# Patient Record
Sex: Female | Born: 1990 | Race: White | Hispanic: No | Marital: Married | Smoking: Former smoker
Health system: Southern US, Community
[De-identification: ages and names within clinical notes are randomized; demographics above are authoritative.]

## PROBLEM LIST (undated history)

## (undated) ENCOUNTER — Inpatient Hospital Stay (HOSPITAL_COMMUNITY): Payer: Self-pay

## (undated) DIAGNOSIS — F419 Anxiety disorder, unspecified: Secondary | ICD-10-CM

## (undated) DIAGNOSIS — T8859XA Other complications of anesthesia, initial encounter: Secondary | ICD-10-CM

## (undated) DIAGNOSIS — F4541 Pain disorder exclusively related to psychological factors: Secondary | ICD-10-CM

## (undated) DIAGNOSIS — F32A Depression, unspecified: Secondary | ICD-10-CM

## (undated) DIAGNOSIS — R112 Nausea with vomiting, unspecified: Secondary | ICD-10-CM

## (undated) DIAGNOSIS — F329 Major depressive disorder, single episode, unspecified: Secondary | ICD-10-CM

## (undated) DIAGNOSIS — J351 Hypertrophy of tonsils: Secondary | ICD-10-CM

## (undated) DIAGNOSIS — K219 Gastro-esophageal reflux disease without esophagitis: Secondary | ICD-10-CM

## (undated) HISTORY — PX: NO PAST SURGERIES: SHX2092

---

## 2009-06-10 ENCOUNTER — Emergency Department (HOSPITAL_COMMUNITY): Admission: EM | Admit: 2009-06-10 | Discharge: 2009-06-10 | Payer: Self-pay | Admitting: Emergency Medicine

## 2010-04-21 LAB — URINALYSIS, ROUTINE W REFLEX MICROSCOPIC
Hgb urine dipstick: NEGATIVE
Nitrite: NEGATIVE
Specific Gravity, Urine: 1.015 (ref 1.005–1.030)
Urobilinogen, UA: 0.2 mg/dL (ref 0.0–1.0)

## 2010-04-21 LAB — PREGNANCY, URINE: Preg Test, Ur: NEGATIVE

## 2013-08-25 ENCOUNTER — Encounter (HOSPITAL_COMMUNITY): Payer: Self-pay | Admitting: Emergency Medicine

## 2013-08-25 ENCOUNTER — Emergency Department (HOSPITAL_COMMUNITY)
Admission: EM | Admit: 2013-08-25 | Discharge: 2013-08-25 | Disposition: A | Discharge status: Home / Self Care | Payer: Managed Care, Other (non HMO) | Attending: Emergency Medicine | Admitting: Emergency Medicine

## 2013-08-25 DIAGNOSIS — R109 Unspecified abdominal pain: Secondary | ICD-10-CM | POA: Insufficient documentation

## 2013-08-25 DIAGNOSIS — N949 Unspecified condition associated with female genital organs and menstrual cycle: Secondary | ICD-10-CM | POA: Insufficient documentation

## 2013-08-25 DIAGNOSIS — Z3202 Encounter for pregnancy test, result negative: Secondary | ICD-10-CM | POA: Insufficient documentation

## 2013-08-25 DIAGNOSIS — R51 Headache: Secondary | ICD-10-CM | POA: Insufficient documentation

## 2013-08-25 DIAGNOSIS — F17200 Nicotine dependence, unspecified, uncomplicated: Secondary | ICD-10-CM | POA: Insufficient documentation

## 2013-08-25 DIAGNOSIS — R102 Pelvic and perineal pain: Secondary | ICD-10-CM

## 2013-08-25 DIAGNOSIS — M549 Dorsalgia, unspecified: Secondary | ICD-10-CM | POA: Insufficient documentation

## 2013-08-25 DIAGNOSIS — R110 Nausea: Secondary | ICD-10-CM | POA: Insufficient documentation

## 2013-08-25 LAB — CBC WITH DIFFERENTIAL/PLATELET
BASOS ABS: 0 10*3/uL (ref 0.0–0.1)
BASOS PCT: 0 % (ref 0–1)
Band Neutrophils: 0 % (ref 0–10)
Blasts: 0 %
EOS PCT: 2 % (ref 0–5)
Eosinophils Absolute: 0.3 10*3/uL (ref 0.0–0.7)
HEMATOCRIT: 38.9 % (ref 36.0–46.0)
HEMOGLOBIN: 13.3 g/dL (ref 12.0–15.0)
LYMPHS ABS: 2.6 10*3/uL (ref 0.7–4.0)
LYMPHS PCT: 17 % (ref 12–46)
MCH: 29 pg (ref 26.0–34.0)
MCHC: 34.2 g/dL (ref 30.0–36.0)
MCV: 84.9 fL (ref 78.0–100.0)
MONO ABS: 0.2 10*3/uL (ref 0.1–1.0)
MONOS PCT: 1 % — AB (ref 3–12)
Metamyelocytes Relative: 0 %
Myelocytes: 0 %
NEUTROS ABS: 11.9 10*3/uL — AB (ref 1.7–7.7)
NEUTROS PCT: 80 % — AB (ref 43–77)
PROMYELOCYTES ABS: 0 %
Platelets: 325 10*3/uL (ref 150–400)
RBC: 4.58 MIL/uL (ref 3.87–5.11)
RDW: 12.6 % (ref 11.5–15.5)
WBC: 15 10*3/uL — AB (ref 4.0–10.5)
nRBC: 0 /100 WBC

## 2013-08-25 LAB — URINALYSIS, ROUTINE W REFLEX MICROSCOPIC
BILIRUBIN URINE: NEGATIVE
GLUCOSE, UA: NEGATIVE mg/dL
KETONES UR: NEGATIVE mg/dL
Leukocytes, UA: NEGATIVE
Nitrite: NEGATIVE
PH: 6.5 (ref 5.0–8.0)
Protein, ur: NEGATIVE mg/dL
SPECIFIC GRAVITY, URINE: 1.015 (ref 1.005–1.030)
Urobilinogen, UA: 0.2 mg/dL (ref 0.0–1.0)

## 2013-08-25 LAB — WET PREP, GENITAL
CLUE CELLS WET PREP: NONE SEEN
Trich, Wet Prep: NONE SEEN
WBC WET PREP: NONE SEEN
Yeast Wet Prep HPF POC: NONE SEEN

## 2013-08-25 LAB — URINE MICROSCOPIC-ADD ON

## 2013-08-25 LAB — POC URINE PREG, ED: Preg Test, Ur: NEGATIVE

## 2013-08-25 NOTE — ED Provider Notes (Signed)
CSN: [id number]     Arrival date & time 08/25/13  1800 History   First MD Initiated Contact with Patient 08/25/13 1833     Chief Complaint  Patient presents with  . Abdominal Pain     (Consider location/radiation/quality/duration/timing/severity/associated sxs/prior Treatment) Patient is a 23 y.o. female presenting with abdominal pain. The history is provided by the patient.  Abdominal Pain Pain location:  RLQ and LLQ Pain quality: dull   Pain radiates to:  Does not radiate Pain severity:  Moderate Onset quality:  Gradual Duration:  3 days Timing:  Constant Progression:  Worsening Chronicity:  New Relieved by: having a BM helps it for a while. Worsened by:  Nothing tried Ineffective treatments:  NSAIDs Associated symptoms: nausea   Associated symptoms: no anorexia, no chest pain, no chills, no constipation, no cough, no diarrhea, no dysuria, no fever, no hematuria, no melena, no vaginal bleeding, no vaginal discharge and no vomiting    Tiffani Kadow is a 23 y.o. female who presents to the ED with abdominal pain. Patient states she went to Urgent Care 7/17 and was told that her WBC was elevated and gave her an antibiotic. They did not do a pelvic exam. Since then the pain has gotten worse. She had an antibiotic injection at Urgent Care and was given an Rx for a medication to take 3 times a day but she is unsure of what it was and she only took it for 5 days.   History reviewed. No pertinent past medical history. History reviewed. No pertinent past surgical history. Family History  Problem Relation Age of Onset  . Hyperlipidemia Mother   . Hyperlipidemia Father   . Heart failure Brother   . Stroke Brother    History  Substance Use Topics  . Smoking status: Current Some Day Smoker  . Smokeless tobacco: Not on file  . Alcohol Use: No   OB History   Grav Para Term Preterm Abortions TAB SAB Ect Mult Living                 Review of Systems  Constitutional: Negative for  fever and chills.  Eyes: Negative for visual disturbance.  Respiratory: Negative for cough.   Cardiovascular: Negative for chest pain and palpitations.  Gastrointestinal: Positive for nausea and abdominal pain. Negative for vomiting, diarrhea, constipation, blood in stool, melena and anorexia.  Genitourinary: Negative for dysuria, hematuria, vaginal bleeding and vaginal discharge.  Musculoskeletal: Positive for back pain. Negative for neck pain and neck stiffness.  Neurological: Positive for headaches. Negative for seizures and syncope.  Psychiatric/Behavioral: Negative for confusion. The patient is not nervous/anxious.       Allergies  Review of patient's allergies indicates no known allergies.  Home Medications   Prior to Admission medications   Not on File   BP 147/91  Pulse 118  Temp(Src) 98.5 F (36.9 C) (Oral)  Ht 5\' 2"  (1.575 m)  Wt 160 lb (72.576 kg)  BMI 29.26 kg/m2  SpO2 100%  LMP 08/18/2013 Physical Exam  Nursing note and vitals reviewed. Constitutional: She is oriented to person, place, and time. She appears well-developed and well-nourished. No distress.  HENT:  Head: Normocephalic.  Eyes: EOM are normal.  Neck: Neck supple.  Cardiovascular: Normal rate.   Pulmonary/Chest: Effort normal.  Abdominal: Soft. There is tenderness in the right lower quadrant and left lower quadrant. There is no rebound, no guarding and no CVA tenderness.  Genitourinary:  External genitalia without lesions. White discharge vaginal vault. Mild  CMT, bilateral adnexal tenderness. Uterus without palpable enlargement.   Musculoskeletal: Normal range of motion.  Neurological: She is alert and oriented to person, place, and time. No cranial nerve deficit.  Skin: Skin is warm and dry.  Psychiatric: She has a normal mood and affect. Her behavior is normal.   Results for orders placed during the hospital encounter of 08/25/13 (from the past 24 hour(s))  URINALYSIS, ROUTINE W REFLEX  MICROSCOPIC     Status: Abnormal   Collection Time    08/25/13  6:25 PM      Result Value Ref Range   Color, Urine YELLOW  YELLOW   APPearance CLEAR  CLEAR   Specific Gravity, Urine 1.015  1.005 - 1.030   pH 6.5  5.0 - 8.0   Glucose, UA NEGATIVE  NEGATIVE mg/dL   Hgb urine dipstick TRACE (*) NEGATIVE   Bilirubin Urine NEGATIVE  NEGATIVE   Ketones, ur NEGATIVE  NEGATIVE mg/dL   Protein, ur NEGATIVE  NEGATIVE mg/dL   Urobilinogen, UA 0.2  0.0 - 1.0 mg/dL   Nitrite NEGATIVE  NEGATIVE   Leukocytes, UA NEGATIVE  NEGATIVE  URINE MICROSCOPIC-ADD ON     Status: None   Collection Time    08/25/13  6:25 PM      Result Value Ref Range   WBC, UA 0-2  <3 WBC/hpf   RBC / HPF 0-2  <3 RBC/hpf  POC URINE PREG, ED     Status: None   Collection Time    08/25/13  6:50 PM      Result Value Ref Range   Preg Test, Ur NEGATIVE  NEGATIVE  CBC WITH DIFFERENTIAL     Status: Abnormal   Collection Time    08/25/13  7:34 PM      Result Value Ref Range   WBC 15.0 (*) 4.0 - 10.5 K/uL   RBC 4.58  3.87 - 5.11 MIL/uL   Hemoglobin 13.3  12.0 - 15.0 g/dL   HCT 16.138.9  09.636.0 - 04.546.0 %   MCV 84.9  78.0 - 100.0 fL   MCH 29.0  26.0 - 34.0 pg   MCHC 34.2  30.0 - 36.0 g/dL   RDW 40.912.6  81.111.5 - 91.415.5 %   Platelets 325  150 - 400 K/uL   Neutrophils Relative % 80 (*) 43 - 77 %   Lymphocytes Relative 17  12 - 46 %   Monocytes Relative 1 (*) 3 - 12 %   Eosinophils Relative 2  0 - 5 %   Basophils Relative 0  0 - 1 %   Band Neutrophils 0  0 - 10 %   Metamyelocytes Relative 0     Myelocytes 0     Promyelocytes Absolute 0     Blasts 0     nRBC 0  0 /100 WBC   Neutro Abs 11.9 (*) 1.7 - 7.7 K/uL   Lymphs Abs 2.6  0.7 - 4.0 K/uL   Monocytes Absolute 0.2  0.1 - 1.0 K/uL   Eosinophils Absolute 0.3  0.0 - 0.7 K/uL   Basophils Absolute 0.0  0.0 - 0.1 K/uL  WET PREP, GENITAL     Status: None   Collection Time    08/25/13  7:40 PM      Result Value Ref Range   Yeast Wet Prep HPF POC NONE SEEN  NONE SEEN   Trich, Wet Prep  NONE SEEN  NONE SEEN   Clue Cells Wet Prep HPF POC NONE SEEN  NONE SEEN   WBC, Wet Prep HPF POC NONE SEEN  NONE SEEN    ED Course  Procedures (including critical care time) Labs Review  MDM  23 y.o. female with pelvic pain x 3 days. Doubt torsion as the pain is not severe, no concern for ectopic pregnancy since pregnancy test is negative. Will return for ultrasound tomorrow for possible pelvic infection vs ovarian cyst. Concern for infection due to elevated WBC. Stable for discharge. Patient does not want pain medication.     [place], [place] 08/25/13 2038

## 2013-08-25 NOTE — ED Notes (Signed)
Patient with no complaints at this time. Respirations even and unlabored. Skin warm/dry. Discharge instructions reviewed with patient at this time. Patient given opportunity to voice concerns/ask questions. Patient discharged at this time and left Emergency Department with steady gait.   

## 2013-08-25 NOTE — Discharge Instructions (Signed)
Return in the morning for ultrasound. Return sooner for any problems.

## 2013-08-25 NOTE — ED Notes (Signed)
Pain in lower abdomen.  Rates pain 5.  Took ibuprofen, with no relief.

## 2013-08-26 ENCOUNTER — Other Ambulatory Visit (HOSPITAL_COMMUNITY): Payer: Self-pay | Admitting: Nurse Practitioner

## 2013-08-26 ENCOUNTER — Ambulatory Visit (HOSPITAL_COMMUNITY)
Admit: 2013-08-26 | Discharge: 2013-08-26 | Disposition: A | Discharge status: Home / Self Care | Payer: Managed Care, Other (non HMO) | Source: Ambulatory Visit | Attending: Emergency Medicine | Admitting: Emergency Medicine

## 2013-08-26 DIAGNOSIS — R102 Pelvic and perineal pain: Secondary | ICD-10-CM

## 2013-08-26 DIAGNOSIS — D72829 Elevated white blood cell count, unspecified: Secondary | ICD-10-CM | POA: Insufficient documentation

## 2013-08-26 DIAGNOSIS — N949 Unspecified condition associated with female genital organs and menstrual cycle: Secondary | ICD-10-CM | POA: Insufficient documentation

## 2013-08-26 LAB — HIV ANTIBODY (ROUTINE TESTING W REFLEX): HIV: NONREACTIVE

## 2013-08-26 LAB — RPR

## 2013-08-26 NOTE — ED Provider Notes (Signed)
Pt seen in f/u for outpatient ultrasound Pelvic US negative Advised pt if she is still having symptoms she can check back in for re-evaluation Otherwise she can f/u with gynecology Informed that other testing (STD/HIV) still pending at this time Pt awake/alert, stable appearing   Joya Gaskinsonald W Duriel Deery, MD 08/26/13 1058

## 2013-08-27 ENCOUNTER — Telehealth (HOSPITAL_BASED_OUTPATIENT_CLINIC_OR_DEPARTMENT_OTHER): Payer: Self-pay

## 2013-08-27 LAB — GC/CHLAMYDIA PROBE AMP
CT Probe RNA: NEGATIVE
GC Probe RNA: NEGATIVE

## 2013-08-28 ENCOUNTER — Telehealth (HOSPITAL_BASED_OUTPATIENT_CLINIC_OR_DEPARTMENT_OTHER): Payer: Self-pay

## 2013-08-28 NOTE — ED Provider Notes (Signed)
Medical screening examination/treatment/procedure(s) were conducted as a shared visit with non-physician practitioner(s) and myself.  I personally evaluated the patient during the encounter.   EKG Interpretation None     No acute abdomen. No evidence of appendicitis. Will return tomorrow for ultrasound of pelvis   Donnetta HutchingBrian Richie Vadala, MD 08/28/13 1354

## 2013-11-24 ENCOUNTER — Encounter (HOSPITAL_COMMUNITY): Payer: Self-pay | Admitting: Emergency Medicine

## 2013-11-24 ENCOUNTER — Emergency Department (HOSPITAL_COMMUNITY)
Admission: EM | Admit: 2013-11-24 | Discharge: 2013-11-24 | Disposition: A | Discharge status: Home / Self Care | Payer: Managed Care, Other (non HMO) | Attending: Emergency Medicine | Admitting: Emergency Medicine

## 2013-11-24 DIAGNOSIS — R29810 Facial weakness: Secondary | ICD-10-CM | POA: Diagnosis present

## 2013-11-24 DIAGNOSIS — Z79899 Other long term (current) drug therapy: Secondary | ICD-10-CM | POA: Diagnosis not present

## 2013-11-24 DIAGNOSIS — Z720 Tobacco use: Secondary | ICD-10-CM | POA: Insufficient documentation

## 2013-11-24 DIAGNOSIS — R200 Anesthesia of skin: Secondary | ICD-10-CM | POA: Diagnosis not present

## 2013-11-24 DIAGNOSIS — G510 Bell's palsy: Secondary | ICD-10-CM | POA: Diagnosis not present

## 2013-11-24 DIAGNOSIS — Z792 Long term (current) use of antibiotics: Secondary | ICD-10-CM | POA: Insufficient documentation

## 2013-11-24 MED ORDER — PREDNISONE 50 MG PO TABS: ORAL_TABLET | ORAL | Status: DC

## 2013-11-24 MED ORDER — TOBRAMYCIN 0.3 % OP SOLN: 1.0000 [drp] | OPHTHALMIC | Status: DC

## 2013-11-24 MED ORDER — VALACYCLOVIR HCL 1 G PO TABS: 500.0000 mg | ORAL_TABLET | Freq: Two times a day (BID) | ORAL | Status: AC

## 2013-11-24 NOTE — ED Notes (Addendum)
Per patient woke this morning with left side facial drooping. Per patient started last night around 9-9:30 but cleared up on it's on. Patient states this has happened before but went away by itself. Denies any weakness, dizziness, or headache.

## 2013-11-24 NOTE — ED Provider Notes (Signed)
CSN: [id number]     Arrival date & time 11/24/13  1038 History  This chart was scribed for Donnetta HutchingBrian Loris Seelye, MD by Karle PlumberJennifer Tensley, ED Scribe. This patient was seen in room APA07/[facility]07 and the patient's care was started at 3:25 PM.  Chief Complaint  Patient presents with  . Facial Droop   HPI HPI Comments:  Alicia Goodman is a 23 y.o. female who presents to the Emergency Department complaining of left-sided facial droop that began last night. She reports watering of her right eye and dryness of her left eye. She states the left side of her smile is crooked and has had some tongue numbness. She denies taste change or pain. She denies any chronic illnesses. Denies any recent illnesses. Denies any extremity weakness or numbness, HA, fever or chills.  History reviewed. No pertinent past medical history. History reviewed. No pertinent past surgical history. Family History  Problem Relation Age of Onset  . Hyperlipidemia Mother   . Hyperlipidemia Father   . Heart failure Brother   . Stroke Brother    History  Substance Use Topics  . Smoking status: Current Some Day Smoker    Types: Cigarettes  . Smokeless tobacco: Never Used  . Alcohol Use: No   OB History   Grav Para Term Preterm Abortions TAB SAB Ect Mult Living   1 1 1       1      Review of Systems  Constitutional: Negative for fever and chills.  Musculoskeletal: Negative for back pain and neck pain.  Skin: Negative for wound.  Neurological: Positive for facial asymmetry and numbness. Negative for weakness and headaches.  All other systems reviewed and are negative.   Allergies  Review of patient's allergies indicates no known allergies.  Home Medications   Prior to Admission medications   Medication Sig Start Date End Date Taking? Authorizing Provider  ibuprofen (ADVIL,MOTRIN) 200 MG tablet Take 600 mg by mouth every 4 (four) hours as needed for mild pain or moderate pain.    Yes Historical Provider, MD  predniSONE  (DELTASONE) 50 MG tablet One tab qd for 5 days;  One half tab qd for 5 days 11/24/13   Donnetta HutchingBrian Savannha Welle, MD  tobramycin (TOBREX) 0.3 % ophthalmic solution Place 1 drop into the right eye every 4 (four) hours. 11/24/13   Donnetta HutchingBrian Bates [place], MD  valACYclovir (VALTREX) 1000 MG tablet Take 0.5 tablets (500 mg total) by mouth 2 (two) times daily. 11/24/13 12/08/13  Donnetta HutchingBrian Tayjon Halladay, MD   Triage Vitals: BP 137/98  Pulse 119  Temp(Src) 98.3 F (36.8 C) (Oral)  Resp 16  Ht 5\' 2"  (1.575 m)  Wt 171 lb 11.2 oz (77.883 kg)  BMI 31.40 kg/m2  SpO2 100%  LMP 11/17/2013 Physical Exam  Nursing note and vitals reviewed. Constitutional: She is oriented to person, place, and time. She appears well-developed and well-nourished.  HENT:  Head: Normocephalic and atraumatic.  Right eye conjunctiva inflammation. Numbness of left cheek. Asymmetry of smile.  Eyes: Conjunctivae and EOM are normal. Pupils are equal, round, and reactive to light.  Neck: Normal range of motion. Neck supple.  Cardiovascular: Normal rate, regular rhythm and normal heart sounds.   Pulmonary/Chest: Effort normal and breath sounds normal.  Abdominal: Soft. Bowel sounds are normal.  Musculoskeletal: Normal range of motion.  Neurological: She is alert and oriented to person, place, and time.  Skin: Skin is warm and dry.  Psychiatric: She has a normal mood and affect. Her behavior is normal.  ED Course  Procedures (including critical care time) DIAGNOSTIC STUDIES: Oxygen Saturation is 100% on RA, normal by my interpretation.   COORDINATION OF CARE: 3:33 PM- Will prescribe Prednisone, antiviral medication and antibiotic opthalmic drops. Pt verbalizes understanding and agrees to plan.  Medications - No data to display  Labs Review Labs Reviewed - No data to display  Imaging Review No results found.   EKG Interpretation None      MDM   Final diagnoses:  Bell's palsy    History and physical consistent with Bell's palsy.   Rx Valtrex and  prednisone. Tobramycin eyedrops for conjunctivitis of the right eye  I personally performed the services described in this documentation, which was scribed in my presence. The recorded information has been reviewed and is accurate.    Donnetta HutchingBrian Haydan Wedig, MD 11/29/13 [phone]

## 2013-11-24 NOTE — Discharge Instructions (Signed)
Bell's Palsy Bell's palsy is a condition in which the muscles on one side of the face cannot move (paralysis). This is because the nerves in the face are paralyzed. It is most often thought to be caused by a virus. The virus causes swelling of the nerve that controls movement on one side of the face. The nerve travels through a tight space surrounded by bone. When the nerve swells, it can be compressed by the bone. This results in damage to the protective covering around the nerve. This damage interferes with how the nerve communicates with the muscles of the face. As a result, it can cause weakness or paralysis of the facial muscles.  Injury (trauma), tumor, and surgery may cause Bell's palsy, but most of the time the cause is unknown. It is a relatively common condition. It starts suddenly (abrupt onset) with the paralysis usually ending within 2 days. Bell's palsy is not dangerous. But because the eye does not close properly, you may need care to keep the eye from getting dry. This can include splinting (to keep the eye shut) or moistening with artificial tears. Bell's palsy very seldom occurs on both sides of the face at the same time. SYMPTOMS   Eyebrow sagging.  Drooping of the eyelid and corner of the mouth.  Inability to close one eye.  Loss of taste on the front of the tongue.  Sensitivity to loud noises. TREATMENT  The treatment is usually non-surgical. If the patient is seen within the first 24 to 48 hours, a short course of steroids may be prescribed, in an attempt to shorten the length of the condition. Antiviral medicines may also be used with the steroids, but it is unclear if they are helpful.  You will need to protect your eye, if you cannot close it. The cornea (clear covering over your eye) will become dry and can be damaged. Artificial tears can be used to keep your eye moist. Glasses or an eye patch should be worn to protect your eye. PROGNOSIS  Recovery is variable, ranging  from days to months. Although the problem usually goes away completely (about 80% of cases resolve), predicting the outcome is impossible. Most people improve within 3 weeks of when the symptoms began. Improvement may continue for 3 to 6 months. A small number of people have moderate to severe weakness that is permanent.  HOME CARE INSTRUCTIONS   If your caregiver prescribed medication to reduce swelling in the nerve, use as directed. Do not stop taking the medication unless directed by your caregiver.  Use moisturizing eye drops as needed to prevent drying of your eye, as directed by your caregiver.  Protect your eye, as directed by your caregiver.  Use facial massage and exercises, as directed by your caregiver.  Perform your normal activities, and get your normal rest. SEEK IMMEDIATE MEDICAL CARE IF:   There is pain, redness or irritation in the eye.  You or your child has an oral temperature above 102 F (38.9 C), not controlled by medicine. MAKE SURE YOU:   Understand these instructions.  Will watch your condition.  Will get help right away if you are not doing well or get worse. Document Released: 01/18/2005 Document Revised: 04/12/2011 Document Reviewed: 04/27/2013 [facility] Patient Information 2015 [employer] [place]. This information is not intended to replace advice given to you by your health care provider. Make sure you discuss any questions you have with your health care provider.  Prescriptions for prednisone, eyedrops, antiviral medication.

## 2013-12-03 ENCOUNTER — Encounter (HOSPITAL_COMMUNITY): Payer: Self-pay | Admitting: Emergency Medicine

## 2017-02-01 HISTORY — PX: TONSILLECTOMY: SUR1361

## 2017-09-29 ENCOUNTER — Ambulatory Visit (INDEPENDENT_AMBULATORY_CARE_PROVIDER_SITE_OTHER): Payer: BLUE CROSS/BLUE SHIELD | Admitting: Otolaryngology

## 2017-09-29 DIAGNOSIS — J351 Hypertrophy of tonsils: Secondary | ICD-10-CM

## 2017-09-29 DIAGNOSIS — J3501 Chronic tonsillitis: Secondary | ICD-10-CM | POA: Diagnosis not present

## 2017-10-26 ENCOUNTER — Other Ambulatory Visit: Payer: Self-pay | Admitting: Otolaryngology

## 2017-11-01 DIAGNOSIS — J351 Hypertrophy of tonsils: Secondary | ICD-10-CM

## 2017-11-01 HISTORY — DX: Hypertrophy of tonsils: J35.1

## 2017-11-08 ENCOUNTER — Encounter (HOSPITAL_BASED_OUTPATIENT_CLINIC_OR_DEPARTMENT_OTHER): Payer: Self-pay | Admitting: *Deleted

## 2017-11-08 ENCOUNTER — Other Ambulatory Visit: Payer: Self-pay

## 2017-11-15 ENCOUNTER — Other Ambulatory Visit: Payer: Self-pay

## 2017-11-15 ENCOUNTER — Ambulatory Visit (HOSPITAL_BASED_OUTPATIENT_CLINIC_OR_DEPARTMENT_OTHER): Payer: BLUE CROSS/BLUE SHIELD | Admitting: Anesthesiology

## 2017-11-15 ENCOUNTER — Encounter (HOSPITAL_BASED_OUTPATIENT_CLINIC_OR_DEPARTMENT_OTHER): Payer: Self-pay

## 2017-11-15 ENCOUNTER — Encounter (HOSPITAL_BASED_OUTPATIENT_CLINIC_OR_DEPARTMENT_OTHER)
Admission: RE | Disposition: A | Discharge status: Home / Self Care | Payer: Self-pay | Source: Ambulatory Visit | Attending: Otolaryngology

## 2017-11-15 ENCOUNTER — Ambulatory Visit (HOSPITAL_BASED_OUTPATIENT_CLINIC_OR_DEPARTMENT_OTHER)
Admission: RE | Admit: 2017-11-15 | Discharge: 2017-11-15 | Disposition: A | Discharge status: Home / Self Care | Payer: BLUE CROSS/BLUE SHIELD | Source: Ambulatory Visit | Attending: Otolaryngology | Admitting: Otolaryngology

## 2017-11-15 DIAGNOSIS — J312 Chronic pharyngitis: Secondary | ICD-10-CM | POA: Diagnosis not present

## 2017-11-15 DIAGNOSIS — J353 Hypertrophy of tonsils with hypertrophy of adenoids: Secondary | ICD-10-CM | POA: Diagnosis present

## 2017-11-15 DIAGNOSIS — K219 Gastro-esophageal reflux disease without esophagitis: Secondary | ICD-10-CM | POA: Insufficient documentation

## 2017-11-15 DIAGNOSIS — F329 Major depressive disorder, single episode, unspecified: Secondary | ICD-10-CM | POA: Diagnosis not present

## 2017-11-15 DIAGNOSIS — J3501 Chronic tonsillitis: Secondary | ICD-10-CM | POA: Insufficient documentation

## 2017-11-15 HISTORY — DX: Depression, unspecified: F32.A

## 2017-11-15 HISTORY — DX: Hypertrophy of tonsils: J35.1

## 2017-11-15 HISTORY — DX: Pain disorder exclusively related to psychological factors: F45.41

## 2017-11-15 HISTORY — PX: TONSILLECTOMY AND ADENOIDECTOMY: SHX28

## 2017-11-15 HISTORY — DX: Gastro-esophageal reflux disease without esophagitis: K21.9

## 2017-11-15 HISTORY — DX: Major depressive disorder, single episode, unspecified: F32.9

## 2017-11-15 SURGERY — TONSILLECTOMY AND ADENOIDECTOMY
Anesthesia: General | Laterality: Bilateral

## 2017-11-15 MED ORDER — PROPOFOL 10 MG/ML IV BOLUS
INTRAVENOUS | Status: AC
  Filled 2017-11-15: qty 20

## 2017-11-15 MED ORDER — KETOROLAC TROMETHAMINE 30 MG/ML IJ SOLN
INTRAMUSCULAR | Status: AC
  Filled 2017-11-15: qty 1

## 2017-11-15 MED ORDER — FENTANYL CITRATE (PF) 100 MCG/2ML IJ SOLN
INTRAMUSCULAR | Status: AC
  Filled 2017-11-15: qty 2

## 2017-11-15 MED ORDER — DEXAMETHASONE SODIUM PHOSPHATE 10 MG/ML IJ SOLN
INTRAMUSCULAR | Status: AC
  Filled 2017-11-15: qty 1

## 2017-11-15 MED ORDER — ONDANSETRON HCL 4 MG/2ML IJ SOLN
INTRAMUSCULAR | Status: AC
  Filled 2017-11-15: qty 2

## 2017-11-15 MED ORDER — OXYCODONE HCL 5 MG PO TABS: 5.0000 mg | ORAL_TABLET | Freq: Once | ORAL | Status: DC | PRN

## 2017-11-15 MED ORDER — SUCCINYLCHOLINE CHLORIDE 200 MG/10ML IV SOSY
PREFILLED_SYRINGE | INTRAVENOUS | Status: AC
  Filled 2017-11-15: qty 10

## 2017-11-15 MED ORDER — DEXAMETHASONE SODIUM PHOSPHATE 4 MG/ML IJ SOLN
INTRAMUSCULAR | Status: DC | PRN
  Administered 2017-11-15: 10 mg via INTRAVENOUS

## 2017-11-15 MED ORDER — HYDROMORPHONE HCL 1 MG/ML IJ SOLN: 0.2500 mg | INTRAMUSCULAR | Status: DC | PRN

## 2017-11-15 MED ORDER — OXYCODONE HCL 5 MG/5ML PO SOLN: 5.0000 mg | ORAL | 0 refills | Status: DC | PRN

## 2017-11-15 MED ORDER — SCOPOLAMINE 1 MG/3DAYS TD PT72: 1.0000 | MEDICATED_PATCH | Freq: Once | TRANSDERMAL | Status: DC | PRN

## 2017-11-15 MED ORDER — OXYMETAZOLINE HCL 0.05 % NA SOLN
NASAL | Status: DC | PRN
  Administered 2017-11-15: 1 via TOPICAL

## 2017-11-15 MED ORDER — LABETALOL HCL 5 MG/ML IV SOLN
INTRAVENOUS | Status: AC
  Filled 2017-11-15: qty 4

## 2017-11-15 MED ORDER — OXYCODONE HCL 5 MG/5ML PO SOLN: 5.0000 mg | Freq: Once | ORAL | Status: DC | PRN

## 2017-11-15 MED ORDER — MIDAZOLAM HCL 2 MG/2ML IJ SOLN
INTRAMUSCULAR | Status: AC
  Filled 2017-11-15: qty 2

## 2017-11-15 MED ORDER — ALBUTEROL SULFATE HFA 108 (90 BASE) MCG/ACT IN AERS
INHALATION_SPRAY | RESPIRATORY_TRACT | Status: DC | PRN
  Administered 2017-11-15 (×3): 2 via RESPIRATORY_TRACT

## 2017-11-15 MED ORDER — LIDOCAINE 2% (20 MG/ML) 5 ML SYRINGE
INTRAMUSCULAR | Status: AC
  Filled 2017-11-15: qty 5

## 2017-11-15 MED ORDER — PROPOFOL 10 MG/ML IV BOLUS
INTRAVENOUS | Status: DC | PRN
  Administered 2017-11-15: 30 mg via INTRAVENOUS
  Administered 2017-11-15: 150 mg via INTRAVENOUS
  Administered 2017-11-15: 20 mg via INTRAVENOUS

## 2017-11-15 MED ORDER — SODIUM CHLORIDE 0.9 % IR SOLN
Status: DC | PRN
  Administered 2017-11-15: 1

## 2017-11-15 MED ORDER — FENTANYL CITRATE (PF) 100 MCG/2ML IJ SOLN
50.0000 ug | INTRAMUSCULAR | Status: DC | PRN
  Administered 2017-11-15 (×2): 100 ug via INTRAVENOUS

## 2017-11-15 MED ORDER — LIDOCAINE HCL (CARDIAC) PF 100 MG/5ML IV SOSY
PREFILLED_SYRINGE | INTRAVENOUS | Status: DC | PRN
  Administered 2017-11-15: 50 mg via INTRAVENOUS
  Administered 2017-11-15: 100 mg via INTRAVENOUS

## 2017-11-15 MED ORDER — LACTATED RINGERS IV SOLN
INTRAVENOUS | Status: DC
  Administered 2017-11-15 (×2): via INTRAVENOUS

## 2017-11-15 MED ORDER — MEPERIDINE HCL 25 MG/ML IJ SOLN: 6.2500 mg | INTRAMUSCULAR | Status: DC | PRN

## 2017-11-15 MED ORDER — PROMETHAZINE HCL 25 MG/ML IJ SOLN: 6.2500 mg | INTRAMUSCULAR | Status: DC | PRN

## 2017-11-15 MED ORDER — AMOXICILLIN 400 MG/5ML PO SUSR: 800.0000 mg | Freq: Two times a day (BID) | ORAL | 0 refills | Status: AC

## 2017-11-15 MED ORDER — LABETALOL HCL 5 MG/ML IV SOLN
10.0000 mg | INTRAVENOUS | Status: DC | PRN
  Administered 2017-11-15: 5 mg via INTRAVENOUS

## 2017-11-15 MED ORDER — MIDAZOLAM HCL 2 MG/2ML IJ SOLN
1.0000 mg | INTRAMUSCULAR | Status: DC | PRN
  Administered 2017-11-15: 2 mg via INTRAVENOUS

## 2017-11-15 MED ORDER — GLYCOPYRROLATE 0.2 MG/ML IJ SOLN
INTRAMUSCULAR | Status: DC | PRN
  Administered 2017-11-15: 0.2 mg via INTRAVENOUS

## 2017-11-15 MED ORDER — ONDANSETRON HCL 4 MG/2ML IJ SOLN
INTRAMUSCULAR | Status: DC | PRN
  Administered 2017-11-15: 4 mg via INTRAVENOUS

## 2017-11-15 SURGICAL SUPPLY — 32 items
BANDAGE COBAN STERILE 2 (GAUZE/BANDAGES/DRESSINGS) IMPLANT
CANISTER SUCT 1200ML W/VALVE (MISCELLANEOUS) ×3 IMPLANT
CATH ROBINSON RED A/P 10FR (CATHETERS) IMPLANT
CATH ROBINSON RED A/P 14FR (CATHETERS) ×3 IMPLANT
COAGULATOR SUCT 6 FR SWTCH (ELECTROSURGICAL)
COAGULATOR SUCT SWTCH 10FR 6 (ELECTROSURGICAL) IMPLANT
COVER BACK TABLE 60X90IN (DRAPES) ×3 IMPLANT
COVER MAYO STAND STRL (DRAPES) ×3 IMPLANT
COVER WAND RF STERILE (DRAPES) IMPLANT
ELECT REM PT RETURN 9FT ADLT (ELECTROSURGICAL) ×3
ELECT REM PT RETURN 9FT PED (ELECTROSURGICAL)
ELECTRODE REM PT RETRN 9FT PED (ELECTROSURGICAL) IMPLANT
ELECTRODE REM PT RTRN 9FT ADLT (ELECTROSURGICAL) ×1 IMPLANT
GAUZE SPONGE 4X4 12PLY STRL LF (GAUZE/BANDAGES/DRESSINGS) ×3 IMPLANT
GLOVE BIO SURGEON STRL SZ7.5 (GLOVE) ×3 IMPLANT
GOWN STRL REUS W/ TWL LRG LVL3 (GOWN DISPOSABLE) ×1 IMPLANT
GOWN STRL REUS W/TWL LRG LVL3 (GOWN DISPOSABLE) ×2
IV NS 500ML (IV SOLUTION) ×2
IV NS 500ML BAXH (IV SOLUTION) ×1 IMPLANT
MARKER SKIN DUAL TIP RULER LAB (MISCELLANEOUS) IMPLANT
NS IRRIG 1000ML POUR BTL (IV SOLUTION) ×3 IMPLANT
SHEET MEDIUM DRAPE 40X70 STRL (DRAPES) ×3 IMPLANT
SOLUTION BUTLER CLEAR DIP (MISCELLANEOUS) ×3 IMPLANT
SPONGE TONSIL TAPE 1 RFD (DISPOSABLE) IMPLANT
SPONGE TONSIL TAPE 1.25 RFD (DISPOSABLE) ×3 IMPLANT
SYR BULB 3OZ (MISCELLANEOUS) IMPLANT
TOWEL GREEN STERILE FF (TOWEL DISPOSABLE) ×3 IMPLANT
TUBE CONNECTING 20'X1/4 (TUBING) ×1
TUBE CONNECTING 20X1/4 (TUBING) ×2 IMPLANT
TUBE SALEM SUMP 12R W/ARV (TUBING) IMPLANT
TUBE SALEM SUMP 16 FR W/ARV (TUBING) ×3 IMPLANT
WAND COBLATOR 70 EVAC XTRA (SURGICAL WAND) ×3 IMPLANT

## 2017-11-15 NOTE — Anesthesia Procedure Notes (Signed)
Procedure Name: Intubation Date/Time: 11/15/2017 7:59 AM Performed by: Lyndee Leo, CRNA Pre-anesthesia Checklist: Patient identified, Emergency Drugs available, Suction available and Patient being monitored Patient Re-evaluated:Patient Re-evaluated prior to induction Oxygen Delivery Method: Circle system utilized Preoxygenation: Pre-oxygenation with 100% oxygen Induction Type: IV induction Ventilation: Mask ventilation without difficulty Laryngoscope Size: Mac and 3 Grade View: Grade I Tube type: Oral Tube size: 7.0 mm Number of attempts: 1 Airway Equipment and Method: Stylet and Oral airway Placement Confirmation: ETT inserted through vocal cords under direct vision,  positive ETCO2 and breath sounds checked- equal and bilateral Secured at: 22 cm Tube secured with: Tape Dental Injury: Teeth and Oropharynx as per pre-operative assessment

## 2017-11-15 NOTE — H&P (Signed)
Cc: Recurrent tonsillitis  HPI: The patient is a 27 y/o female who presents today for evaluation of recurrent tonsillitis. The patient is seen in consultation requested by [facility]. The patient has noted recurrent sore throat for 3+ years. She has been diagnosed with strep 3-4 times this year. The patient was last treated a few weeks ago. She also notes loud snoring, tonsil stones, and halitosis. The patient is otherwise healthy. No previous ENT surgery is noted.   The patient's review of systems (constitutional, eyes, ENT, cardiovascular, respiratory, GI, musculoskeletal, skin, neurologic, psychiatric, endocrine, hematologic, allergic) is noted in the ROS questionnaire.  It is reviewed with the patient.   Family health history: None.   Major events: None.   Ongoing medical problems: None.   Social history: The patient is single. She denies the use of tobacco, alcohol or illegal drugs.   Exam:  General: Communicates without difficulty, well nourished, no acute distress. Head:  Normocephalic, no lesions or asymmetry. Eyes: PERRL, EOMI. No scleral icterus, conjunctivae clear.  Neuro: CN II exam reveals vision grossly intact.  No nystagmus at any point of gaze. Ears:  EAC normal without erythema AU.  TM intact without fluid and mobile AU. Nose: Moist, pink mucosa without lesions or mass. Mouth: Oral cavity clear and moist, no lesions, tonsils symmetric. Tonsils are 3+. Tonsils with mild erythema. Neck: Full range of motion, no lymphadenopathy or masses.   Assessment  1.  The patient's history and physical exam findings are consistent with chronic tonsillitis/pharyngitis secondary to adenotonsillar hypertrophy.  Plan 1. The treatment options include continuing conservative observation versus tonsillectomy.  Based on the patient's history and physical exam findings, the patient will likely benefit from having the tonsils and possibly the adenoid removed.  The risks, benefits,  alternatives, and details of the procedure are reviewed with the patient and the parent.  Questions are invited and answered.  2. The patient is interested in proceeding with the procedure.  We will schedule the procedure in accordance with the family schedule.

## 2017-11-15 NOTE — Transfer of Care (Signed)
Immediate Anesthesia Transfer of Care Note  Patient: Alicia Goodman  Procedure(s) Performed: TONSILLECTOMY (Bilateral )  Patient Location: PACU  Anesthesia Type:General  Level of Consciousness: awake, alert  and oriented  Airway & Oxygen Therapy: Patient Spontanous Breathing and Patient connected to face mask oxygen  Post-op Assessment: Report given to RN and Post -op Vital signs reviewed and stable  Post vital signs: Reviewed and stable  Last Vitals:  Vitals Value Taken Time  BP 149/102 11/15/2017  8:34 AM  Temp    Pulse 135 11/15/2017  8:38 AM  Resp 21 11/15/2017  8:38 AM  SpO2 100 % 11/15/2017  8:38 AM  Vitals shown include unvalidated device data.  Last Pain:  Vitals:   11/15/17 0652  TempSrc: Oral  PainSc: 0-No pain         Complications: No apparent anesthesia complications

## 2017-11-15 NOTE — Discharge Instructions (Addendum)
[facility] Postoperative Instructions for Tonsillectomy & Adenoidectomy (T&A) Activity Restrict activity at home for the first two days, resting as much as possible. Light indoor activity is best. You may usually return to school or work within a week but void strenuous activity and sports for two weeks. Sleep with your head elevated on 2-3 pillows for 3-4 days to help decrease swelling. Diet Due to tissue swelling and throat discomfort, you may have little desire to drink for several days. However fluids are very important to prevent dehydration. You will find that non-acidic juices, soups, popsicles, Jell-O, custard, puddings, and any soft or mashed foods taken in small quantities can be swallowed fairly easily. Try to increase your fluid and food intake as the discomfort subsides. It is recommended that a child receive 1-1/2 quarts of fluid in a 24-hour period. Adult require twice this amount.  Discomfort Your sore throat may be relieved by applying an ice collar to your neck and/or by taking Tylenol. You may experience an earache, which is due to referred pain from the throat. Referred ear pain is commonly felt at night when trying to rest.  Bleeding                        Although rare, there is risk of having some bleeding during the first 2 weeks after having a T&A. This usually happens between days 7-10 postoperatively. If you or your child should have any bleeding, try to remain calm. We recommend sitting up quietly in a chair and gently spitting out the blood into a bowl. For adults, gargling gently with ice water may help. If the bleeding does not stop after a short time (5 minutes), is more than 1 teaspoonful, or if you become worried, please call our office at [phone] or go directly to the nearest hospital emergency room. Do not eat or drink anything prior to going to the hospital as you may need to be taken to the operating room in order to control the bleeding. GENERAL  CONSIDERATIONS 1. Brush your teeth regularly. Avoid mouthwashes and gargles for three weeks. You may gargle gently with warm salt-water as necessary or spray with Chloraseptic. You may make salt-water by placing 2 teaspoons of table salt into a quart of fresh water. Warm the salt-water in a microwave to a luke warm temperature.  2. Avoid exposure to colds and upper respiratory infections if possible.  3. If you look into a mirror or into your child's mouth, you will see white-gray patches in the back of the throat. This is normal after having a T&A and is like a scab that forms on the skin after an abrasion. It will disappear once the back of the throat heals completely. However, it may cause a noticeable odor; this too will disappear with time. Again, warm salt-water gargles may be used to help keep the throat clean and promote healing.  4. You may notice a temporary change in voice quality, such as a higher pitched voice or a nasal sound, until healing is complete. This may last for 1-2 weeks and should resolve.  5. Do not take or give you child any medications that we have not prescribed or recommended.  6. Snoring may occur, especially at night, for the first week after a T&A. It is due to swelling of the soft palate and will usually resolve.  Please call our office at [phone] if you have any questions.       Post Anesthesia Home Care Instructions  Activity: Get plenty of rest for the remainder of the day. A responsible individual must stay with you for 24 hours following the procedure.  For the next 24 hours, DO NOT: -Drive a car -Operate machinery -Drink alcoholic beverages -Take any medication unless instructed by your physician -Make any legal decisions or sign important papers.  Meals: Start with liquid foods such as gelatin or soup. Progress to regular foods as tolerated. Avoid greasy, spicy, heavy foods. If nausea and/or vomiting occur, drink only clear liquids until the nausea  and/or vomiting subsides. Call your physician if vomiting continues.  Special Instructions/Symptoms: Your throat may feel dry or sore from the anesthesia or the breathing tube placed in your throat during surgery. If this causes discomfort, gargle with warm salt water. The discomfort should disappear within 24 hours.  If you had a scopolamine patch placed behind your ear for the management of post- operative nausea and/or vomiting:  1. The medication in the patch is effective for 72 hours, after which it should be removed.  Wrap patch in a tissue and discard in the trash. Wash hands thoroughly with soap and water. 2. You may remove the patch earlier than 72 hours if you experience unpleasant side effects which may include dry mouth, dizziness or visual disturbances. 3. Avoid touching the patch. Wash your hands with soap and water after contact with the patch.     

## 2017-11-15 NOTE — Anesthesia Preprocedure Evaluation (Signed)
Anesthesia Evaluation  Patient identified by MRN, date of birth, ID band Patient awake    Reviewed: Allergy & Precautions, NPO status , Patient's Chart, lab work & pertinent test results  Airway Mallampati: II  TM Distance: >3 FB Neck ROM: Full    Dental no notable dental hx.    Pulmonary neg pulmonary ROS, Current Smoker,    Pulmonary exam normal breath sounds clear to auscultation       Cardiovascular negative cardio ROS Normal cardiovascular exam Rhythm:Regular Rate:Normal     Neuro/Psych Depression negative neurological ROS  negative psych ROS   GI/Hepatic Neg liver ROS, GERD  ,  Endo/Other  negative endocrine ROS  Renal/GU negative Renal ROS  negative genitourinary   Musculoskeletal negative musculoskeletal ROS (+)   Abdominal (+) + obese,   Peds negative pediatric ROS (+)  Hematology negative hematology ROS (+)   Anesthesia Other Findings   Reproductive/Obstetrics negative OB ROS                             Anesthesia Physical Anesthesia Plan  ASA: II  Anesthesia Plan: General   Post-op Pain Management:    Induction: Intravenous  PONV Risk Score and Plan: 2 and Ondansetron and Midazolam  Airway Management Planned: Oral ETT  Additional Equipment:   Intra-op Plan:   Post-operative Plan: Extubation in OR  Informed Consent: I have reviewed the patients History and Physical, chart, labs and discussed the procedure including the risks, benefits and alternatives for the proposed anesthesia with the patient or authorized representative who has indicated his/her understanding and acceptance.   Dental advisory given  Plan Discussed with: CRNA  Anesthesia Plan Comments:         Anesthesia Quick Evaluation

## 2017-11-15 NOTE — Op Note (Signed)
DATE OF PROCEDURE:  11/15/2017                              OPERATIVE REPORT  SURGEON:  Newman Pies, MD  PREOPERATIVE DIAGNOSES: 1. Adenotonsillar hypertrophy. 2. Chronic tonsillitis and pharyngitis  POSTOPERATIVE DIAGNOSES: 1. Adenotonsillar hypertrophy. 2. Chronic tonsillitis and pharyngitis  PROCEDURE PERFORMED:  Adenotonsillectomy.  ANESTHESIA:  General endotracheal tube anesthesia.  COMPLICATIONS:  None.  ESTIMATED BLOOD LOSS:  Minimal.  INDICATION FOR PROCEDURE:  Alicia Goodman is a 27 y.o. female with a history of chronic tonsillitis/pharyngitis and halitosis.  According to the patient, She has been experiencing chronic throat discomfort with halitosis for several years. The patient continues to be symptomatic despite medical treatments. On examination, the patient was noted to have bilateral cryptic tonsils, with numerous tonsilloliths. Based on the above findings, the decision was made for the patient to undergo the adenotonsillectomy procedure. Likelihood of success in reducing symptoms was also discussed.  The risks, benefits, alternatives, and details of the procedure were discussed with the patient.  Questions were invited and answered.  Informed consent was obtained.  DESCRIPTION:  The patient was taken to the operating room and placed supine on the operating table.  General endotracheal tube anesthesia was administered by the anesthesiologist.  The patient was positioned and prepped and draped in a standard fashion for adenotonsillectomy.  A Crowe-Davis mouth gag was inserted into the oral cavity for exposure. 3+ cryptic tonsils were noted bilaterally.  No bifidity was noted.  Indirect mirror examination of the nasopharynx revealed mild adenoid hypertrophy. The adenoid was ablated with the Coblator device. Hemostasis was achieved with the Coblator device.  The right tonsil was then grasped with a straight Allis clamp and retracted medially.  It was resected free from the  underlying pharyngeal constrictor muscles with the Coblator device.  The same procedure was repeated on the left side without exception.  The surgical sites were copiously irrigated.  The mouth gag was removed.  The care of the patient was turned over to the anesthesiologist.  The patient was awakened from anesthesia without difficulty.  The patient was extubated and transferred to the recovery room in good condition.  OPERATIVE FINDINGS:  Adenotonsillar hypertrophy.  SPECIMEN:  Bilateral tonsils  FOLLOWUP CARE:  The patient will be discharged home once awake and alert.  She will be placed on amoxicillin 800 mg p.o. b.i.d. for 5 days, and oxycodone 5-36ml po q 4 hours for postop pain control.   The patient will follow up in my office in approximately 2 weeks.  Alicia Goodman W Alicia Goodman 11/15/2017 8:47 AM

## 2017-11-15 NOTE — Anesthesia Postprocedure Evaluation (Signed)
Anesthesia Post Note  Patient: Tagan Bartram  Procedure(s) Performed: TONSILLECTOMY (Bilateral )     Patient location during evaluation: PACU Anesthesia Type: General Level of consciousness: awake and alert Pain management: pain level controlled Vital Signs Assessment: post-procedure vital signs reviewed and stable Respiratory status: spontaneous breathing, nonlabored ventilation and respiratory function stable Cardiovascular status: blood pressure returned to baseline and stable Postop Assessment: no apparent nausea or vomiting Anesthetic complications: no    Last Vitals:  Vitals:   11/15/17 0930 11/15/17 1015  BP: 113/69 135/75  Pulse: (!) 102 100  Resp: 18 16  Temp:  36.7 C  SpO2: 97% 98%    Last Pain:  Vitals:   11/15/17 1015  TempSrc:   PainSc: 0-No pain                 Lowella Curb

## 2017-11-16 ENCOUNTER — Encounter (HOSPITAL_BASED_OUTPATIENT_CLINIC_OR_DEPARTMENT_OTHER): Payer: Self-pay | Admitting: Otolaryngology

## 2017-11-16 NOTE — Addendum Note (Signed)
Addendum  created 11/16/17 8295 by Azalynn Maxim, Jewel Baize, CRNA   Charge Capture section accepted

## 2017-12-01 ENCOUNTER — Ambulatory Visit (INDEPENDENT_AMBULATORY_CARE_PROVIDER_SITE_OTHER): Payer: BLUE CROSS/BLUE SHIELD | Admitting: Otolaryngology

## 2019-10-11 ENCOUNTER — Telehealth (HOSPITAL_COMMUNITY): Payer: Self-pay | Admitting: Psychiatry

## 2019-10-11 NOTE — Telephone Encounter (Signed)
Called to schedule NP appt, left voicemail

## 2020-02-03 ENCOUNTER — Emergency Department (HOSPITAL_COMMUNITY)
Admission: EM | Admit: 2020-02-03 | Discharge: 2020-02-03 | Disposition: A | Discharge status: Home / Self Care | Payer: BC Managed Care – PPO | Attending: Emergency Medicine | Admitting: Emergency Medicine

## 2020-02-03 ENCOUNTER — Other Ambulatory Visit: Payer: Self-pay

## 2020-02-03 ENCOUNTER — Encounter (HOSPITAL_COMMUNITY): Payer: Self-pay | Admitting: Emergency Medicine

## 2020-02-03 DIAGNOSIS — Z87891 Personal history of nicotine dependence: Secondary | ICD-10-CM | POA: Diagnosis not present

## 2020-02-03 DIAGNOSIS — R1030 Lower abdominal pain, unspecified: Secondary | ICD-10-CM | POA: Insufficient documentation

## 2020-02-03 DIAGNOSIS — R55 Syncope and collapse: Secondary | ICD-10-CM

## 2020-02-03 LAB — BASIC METABOLIC PANEL
Anion gap: 10 (ref 5–15)
BUN: 12 mg/dL (ref 6–20)
CO2: 25 mmol/L (ref 22–32)
Calcium: 9.2 mg/dL (ref 8.9–10.3)
Chloride: 102 mmol/L (ref 98–111)
Creatinine, Ser: 0.62 mg/dL (ref 0.44–1.00)
GFR, Estimated: >60 mL/min (ref >60)
Glucose, Bld: 106 mg/dL — ABNORMAL HIGH (ref 70–99)
Potassium: 3.2 mmol/L — ABNORMAL LOW (ref 3.5–5.1)
Sodium: 137 mmol/L (ref 135–145)

## 2020-02-03 LAB — CBC
HCT: 41 % (ref 36.0–46.0)
Hemoglobin: 13.4 g/dL (ref 12.0–15.0)
MCH: 29.3 pg (ref 26.0–34.0)
MCHC: 32.7 g/dL (ref 30.0–36.0)
MCV: 89.7 fL (ref 80.0–100.0)
Platelets: 312 10*3/uL (ref 150–400)
RBC: 4.57 MIL/uL (ref 3.87–5.11)
RDW: 13.3 % (ref 11.5–15.5)
WBC: 13.4 10*3/uL — ABNORMAL HIGH (ref 4.0–10.5)
nRBC: 0 % (ref 0.0–0.2)

## 2020-02-03 LAB — URINALYSIS, ROUTINE W REFLEX MICROSCOPIC
Bacteria, UA: NONE SEEN
Bilirubin Urine: NEGATIVE
Glucose, UA: NEGATIVE mg/dL
Hgb urine dipstick: NEGATIVE
Ketones, ur: NEGATIVE mg/dL
Leukocytes,Ua: NEGATIVE
Nitrite: NEGATIVE
Protein, ur: NEGATIVE mg/dL
Specific Gravity, Urine: 1.018 (ref 1.005–1.030)
pH: 6 (ref 5.0–8.0)

## 2020-02-03 LAB — CBG MONITORING, ED: Glucose-Capillary: 131 mg/dL — ABNORMAL HIGH (ref 70–99)

## 2020-02-03 LAB — POC URINE PREG, ED: Preg Test, Ur: NEGATIVE

## 2020-02-03 NOTE — ED Triage Notes (Signed)
Pt states she had a shooting abdominal pain and she loss consciousness.  Pt still c/o intermittent abdominal pain.

## 2020-02-03 NOTE — ED Provider Notes (Signed)
[facility] EMERGENCY DEPARTMENT Provider Note   CSN: [id number] Arrival date & time: 02/03/20  1608     History Chief Complaint  Patient presents with  . Loss of Consciousness    Alicia Goodman is a 30 y.o. female.  HPI Patient presents with abdominal pain.  Shooting in the lower abdomen.  States much improved now.  States that she took a shower and had a bowel movement.  Then after developed severe pain.  States she feels as if she is going to pass out.  States the pain has since improved.  States she was worried because she felt like she is could pass out.  No vaginal bleeding or discharge.  No fevers or chills.  No chest pain.'s been doing well otherwise.  States she had an episode like this 10 years ago but also went away.  Denies pregnancy.    History reviewed. No pertinent past medical history.  There are no problems to display for this patient.   Past Surgical History:  Procedure Laterality Date  . TONSILLECTOMY  2019     OB History   No obstetric history on file.     History reviewed. No pertinent family history.  Social History   Tobacco Use  . Smoking status: Former Games developer  . Smokeless tobacco: Never Used  Vaping Use  . Vaping Use: Never used  Substance Use Topics  . Alcohol use: Not Currently  . Drug use: Not Currently    Home Medications Prior to Admission medications   Not on File    Allergies    Patient has no allergy information on record.  Review of Systems   Review of Systems  Constitutional: Negative for appetite change and fever.  HENT: Negative for congestion.   Respiratory: Negative for shortness of breath.   Gastrointestinal: Positive for abdominal pain.  Genitourinary: Negative for dysuria.  Musculoskeletal: Negative for back pain.  Skin: Negative for pallor.  Neurological: Positive for light-headedness.  Psychiatric/Behavioral: Negative for confusion.    Physical Exam Updated Vital Signs BP 130/82   Pulse 80    Temp 98 F (36.7 C) (Oral)   Resp 18   Ht 5\' 1"  (1.549 m)   Wt 88.5 kg   LMP 12/19/2019 (Approximate)   SpO2 100%   BMI 36.84 kg/m   Physical Exam Vitals and nursing note reviewed.  HENT:     Head: Atraumatic.     Right Ear: External ear normal.     Left Ear: External ear normal.     Nose: Nose normal.  Eyes:     Pupils: Pupils are equal, round, and reactive to light.  Cardiovascular:     Rate and Rhythm: Normal rate and regular rhythm.  Pulmonary:     Breath sounds: No wheezing or rhonchi.  Abdominal:     Tenderness: There is no abdominal tenderness.  Musculoskeletal:        General: Normal range of motion.     Cervical back: Neck supple.  Skin:    General: Skin is warm.     Capillary Refill: Capillary refill takes less than 2 seconds.  Neurological:     Mental Status: She is alert and oriented to person, place, and time.     ED Results / Procedures / Treatments   Labs (all labs ordered are listed, but only abnormal results are displayed) Labs Reviewed  BASIC METABOLIC PANEL - Abnormal; Notable for the following components:      Result Value   Potassium 3.2 (*)  Glucose, Bld 106 (*)    All other components within normal limits  CBC - Abnormal; Notable for the following components:   WBC 13.4 (*)    All other components within normal limits  CBG MONITORING, ED - Abnormal; Notable for the following components:   Glucose-Capillary 131 (*)    All other components within normal limits  URINALYSIS, ROUTINE W REFLEX MICROSCOPIC  POC URINE PREG, ED    EKG EKG Interpretation  Date/Time:  Sunday February 03 2020 16:39:28 EST Ventricular Rate:  86 PR Interval:  144 QRS Duration: 84 QT Interval:  364 QTC Calculation: 435 R Axis:   30 Text Interpretation: Normal sinus rhythm Normal ECG Confirmed by Benjiman Core [phone]) on 02/03/2020 10:19:37 PM   Radiology No results found.  Procedures Procedures (including critical care time)  Medications Ordered in  ED Medications - No data to display  ED Course  I have reviewed the triage vital signs and the nursing notes.  Pertinent labs & imaging results that were available during my care of the patient were reviewed by me and considered in my medical decision making (see chart for details).    MDM Rules/Calculators/A&P                          Patient presented after near syncopal episode.  Began with abdominal pain.  Work-up reassuring.  EKG reassuring.  Abdominal pain is resolved.  Mild elevated white count.  No vaginal bleeding or discharge.  Discussed with patient about option of pelvic exam and patient deferred at this time.  Does have Mirena in place.  I think the near syncope was most likely vagal episode in response abdominal pain.  Will return for fevers or worsening pain.  Will discharge home Final Clinical Impression(s) / ED Diagnoses Final diagnoses:  Near syncope  Lower abdominal pain    Rx / DC Orders ED Discharge Orders    None       Benjiman Core, MD 02/04/20 0003

## 2020-02-03 NOTE — Discharge Instructions (Signed)
Your white count was mildly elevated.  Watch for worsening abdominal pain.  Follow-up with your doctors about the Mirena also.

## 2020-02-04 ENCOUNTER — Encounter (HOSPITAL_BASED_OUTPATIENT_CLINIC_OR_DEPARTMENT_OTHER): Payer: Self-pay | Admitting: Otolaryngology

## 2020-03-02 ENCOUNTER — Other Ambulatory Visit: Payer: Self-pay

## 2020-03-02 ENCOUNTER — Ambulatory Visit
Admission: EM | Admit: 2020-03-02 | Discharge: 2020-03-02 | Discharge status: Absent without leave | Payer: BC Managed Care – PPO

## 2020-12-12 ENCOUNTER — Ambulatory Visit
Admission: EM | Admit: 2020-12-12 | Discharge: 2020-12-12 | Disposition: A | Discharge status: Home / Self Care | Payer: BC Managed Care – PPO | Attending: Emergency Medicine | Admitting: Emergency Medicine

## 2020-12-12 ENCOUNTER — Ambulatory Visit (INDEPENDENT_AMBULATORY_CARE_PROVIDER_SITE_OTHER): Payer: Self-pay

## 2020-12-12 ENCOUNTER — Other Ambulatory Visit: Payer: Self-pay

## 2020-12-12 DIAGNOSIS — M25562 Pain in left knee: Secondary | ICD-10-CM

## 2020-12-12 HISTORY — DX: Anxiety disorder, unspecified: F41.9

## 2020-12-12 MED ORDER — IBUPROFEN 600 MG PO TABS: 600.0000 mg | ORAL_TABLET | Freq: Four times a day (QID) | ORAL | 0 refills | Status: DC | PRN

## 2020-12-12 NOTE — ED Triage Notes (Signed)
Patient presents to Urgent Care with complaints of left knee pain x 1 month. She states increases with ambulation and bending her knee. She's unsure if she moved the wrong way. Since weds she has been having intermittent sharp pain in knee. Treating pain with tylenol and motrin.

## 2020-12-12 NOTE — ED Provider Notes (Signed)
HPI  SUBJECTIVE:  Alicia Goodman is a 30 y.o. female who presents with 2 days of sharp, left medial knee pain.  She reports localized swelling last night, but this has resolved.  She stands for prolonged periods of time at work.  No trauma to the knee.  Denies twisting it.  No change in her physical activity, no erythema, fevers, crepitus.  She states that her knee is giving way secondary to pain.  She has tried lidocaine patch, ice, and elastic knee sleeve, Bengay, ibuprofen 800 mg twice daily.  The knee sleeve and ibuprofen help.  Symptoms worse with weightbearing, and at the end of the day.  Past medical history negative for left knee injury.  LMP: 10/17.  Denies the possibility being pregnant.  PMD: [facility].    Past Medical History:  Diagnosis Date   Anxiety    Depression    GERD (gastroesophageal reflux disease)    Stress headaches    Tonsillar hypertrophy 11/2017    Past Surgical History:  Procedure Laterality Date   NO PAST SURGERIES     TONSILLECTOMY  2019   TONSILLECTOMY AND ADENOIDECTOMY Bilateral 11/15/2017   Procedure: TONSILLECTOMY;  Surgeon: Newman Pies, MD;  Location: [facility];  Service: ENT;  Laterality: Bilateral;    Family History  Problem Relation Age of Onset   Hyperlipidemia Mother    Hyperlipidemia Father    Heart failure Brother    Stroke Brother     Social History   Tobacco Use   Smoking status: Former    Types: Cigarettes    Quit date: 12/12/2016    Years since quitting: 4.0   Smokeless tobacco: Never   Tobacco comments:    social smoker  Vaping Use   Vaping Use: Never used  Substance Use Topics   Alcohol use: Not Currently   Drug use: Not Currently    No current facility-administered medications for this encounter.  Current Outpatient Medications:    ibuprofen (ADVIL) 600 MG tablet, Take 1 tablet (600 mg total) by mouth every 6 (six) hours as needed., Disp: 30 tablet, Rfl: 0   LORazepam (ATIVAN) 0.5 MG tablet, Take  1 tablet by mouth daily as needed., Disp: , Rfl:    famotidine (PEPCID) 20 MG tablet, Take 20 mg by mouth as needed for heartburn or indigestion., Disp: , Rfl:    FLUoxetine (PROZAC) 20 MG tablet, Take 20 mg by mouth daily., Disp: , Rfl:    levonorgestrel (MIRENA) 20 MCG/24HR IUD, 1 each by Intrauterine route once., Disp: , Rfl:   No Known Allergies   ROS  As noted in HPI.   Physical Exam  BP (!) 149/90   Pulse 89   Temp 98.3 F (36.8 C)   Resp 18   LMP 11/27/2020 (Approximate)   SpO2 97%   Constitutional: Well developed, well nourished, no acute distress Eyes:  EOMI, conjunctiva normal bilaterally HENT: Normocephalic, atraumatic,mucus membranes moist Respiratory: Normal inspiratory effort Cardiovascular: Normal rate GI: nondistended skin: No rash, skin intact Musculoskeletal: L Knee;  positive crepitus.  Tenderness along the medial retinaculum.  No other knee joint tenderness.  ROM baseline for Pt, Flexion  intact, Patella NT, Patellar apprehension test negative, Patellar tendon NT, Medial joint NT, Lateral joint NT, Popliteal region NT, Varus MCL stress testing stable, Valgus LCL stress testing stable, McMurray's testing normal, Lachman's negative. Distal NVI with intact baseline sensation / motor / pulse distal to knee.  No effusion. No erythema. No increased temperature. No crepitus.  Neurologic: Alert & oriented x 3, no focal neuro deficits Psychiatric: Speech and behavior appropriate   ED Course   Medications - No data to display  Orders Placed This Encounter  Procedures   DG Knee Complete 4 Views Left    Standing Status:   Standing    Number of Occurrences:   1    Order Specific Question:   Reason for Exam (SYMPTOM  OR DIAGNOSIS REQUIRED)    Answer:   pain    No results found for this or any previous visit (from the past 24 hour(s)). DG Knee Complete 4 Views Left  Result Date: 12/12/2020 CLINICAL DATA:  Left knee pain for the past month. EXAM: LEFT KNEE -  COMPLETE 4+ VIEW COMPARISON:  None. FINDINGS: No evidence of fracture, dislocation, or joint effusion. No evidence of arthropathy or other focal bone abnormality. Soft tissues are unremarkable. IMPRESSION: Negative. Electronically Signed   By: Obie Dredge M.D.   On: 12/12/2020 14:05    ED Clinical Impression  1. Left medial knee pain      ED Assessment/Plan   Reviewed imaging independently. Normal.  See radiology report for full details.  Patient with anterior/medial knee pain.  X-ray is negative for arthritis, fracture, acute changes.  Continue lidocaine patch, ice, knee sleeve.  Home with Tylenol/ibuprofen together 3-4 times a day, follow-up with orthopedics if not better in a week.  Discussed imaging, MDM, treatment plan, and plan for follow-up with patient.. patient agrees with plan.   Meds ordered this encounter  Medications   ibuprofen (ADVIL) 600 MG tablet    Sig: Take 1 tablet (600 mg total) by mouth every 6 (six) hours as needed.    Dispense:  30 tablet    Refill:  0      *This clinic note was created using Scientist, clinical (histocompatibility and immunogenetics). Therefore, there may be occasional mistakes despite careful proofreading.  ?    Domenick Gong, MD 12/14/20 [phone]

## 2020-12-12 NOTE — Discharge Instructions (Addendum)
Continue the knee sleeve, lidocaine patch, Bengay.  Take 600 mg of ibuprofen, 1000 mg of Tylenol together 3-4 times a day as needed for pain.  Ice your knee after use.  Try and rest your knee is much as you can.  Follow-up with orthopedics if you are not better in a week to 10 days.

## 2021-07-29 ENCOUNTER — Encounter: Payer: Self-pay | Admitting: Nurse Practitioner

## 2021-08-26 ENCOUNTER — Other Ambulatory Visit (INDEPENDENT_AMBULATORY_CARE_PROVIDER_SITE_OTHER): Payer: Self-pay

## 2021-08-26 ENCOUNTER — Encounter: Payer: Self-pay | Admitting: Nurse Practitioner

## 2021-08-26 ENCOUNTER — Ambulatory Visit (INDEPENDENT_AMBULATORY_CARE_PROVIDER_SITE_OTHER): Payer: Self-pay | Admitting: Nurse Practitioner

## 2021-08-26 DIAGNOSIS — R161 Splenomegaly, not elsewhere classified: Secondary | ICD-10-CM

## 2021-08-26 DIAGNOSIS — R160 Hepatomegaly, not elsewhere classified: Secondary | ICD-10-CM

## 2021-08-26 DIAGNOSIS — R748 Abnormal levels of other serum enzymes: Secondary | ICD-10-CM

## 2021-08-26 LAB — PROTIME-INR
INR: 1.1 ratio — ABNORMAL HIGH (ref 0.8–1.0)
Prothrombin Time: 11.8 s (ref 9.6–13.1)

## 2021-08-26 LAB — HEPATIC FUNCTION PANEL
ALT: 108 U/L — ABNORMAL HIGH (ref 0–35)
AST: 61 U/L — ABNORMAL HIGH (ref 0–37)
Albumin: 4.5 g/dL (ref 3.5–5.2)
Alkaline Phosphatase: 98 U/L (ref 39–117)
Bilirubin, Direct: 0.2 mg/dL (ref 0.0–0.3)
Total Bilirubin: 0.7 mg/dL (ref 0.2–1.2)
Total Protein: 7.7 g/dL (ref 6.0–8.3)

## 2021-08-26 LAB — FERRITIN: Ferritin: 91.9 ng/mL (ref 10.0–291.0)

## 2021-08-26 NOTE — Patient Instructions (Signed)
Your provider has requested that you go to the basement level for lab work before leaving today. Press "B" on the elevator. The lab is located at the first door on the left as you exit the elevator.   Due to recent changes in healthcare laws, you may see the results of your imaging and laboratory studies on MyChart before your provider has had a chance to review them.  We understand that in some cases there may be results that are confusing or concerning to you. Not all laboratory results come back in the same time frame and the provider may be waiting for multiple results in order to interpret others.  Please give [place] 48 hours in order for your provider to thoroughly review all the results before contacting the office for clarification of your results.    If you are age 33 or older, your body mass index should be between 23-30. Your Body mass index is 39.11 kg/m. If this is out of the aforementioned range listed, please consider follow up with your Primary Care Provider.  If you are age 58 or younger, your body mass index should be between 19-25. Your Body mass index is 39.11 kg/m. If this is out of the aformentioned range listed, please consider follow up with your Primary Care Provider.   ________________________________________________________  The [facility] providers would like to encourage you to use [facility] to communicate with providers for non-urgent requests or questions.  Due to long hold times on the telephone, sending your provider a message by [facility] may be a faster and more efficient way to get a response.  Please allow 48 business hours for a response.  Please remember that this is for non-urgent requests.  _______________________________________________________   I appreciate the  opportunity to care for you  Thank You   Midge Minium

## 2021-08-26 NOTE — Progress Notes (Unsigned)
Chief Complaint:  abnormal liver test, enlarged liver on ultrasound   Assessment & Plan   #31 year old female with abnormal liver enzymes in May and June ( ALT nearly 4x ULN ) and hepatosplenomegaly on [place]. No known history of liver disease. Query infectious etiology? Unlikely to have portal HTN . She has no abdominal pain / tenderness. She feels okay except for intermittent loose stools over last few months which may be unrelated.   Repeat LFTs Obtain NR Obtain a complete hepatic serologic workup Will call with results and further recommendations.  If she continues to have intermittent loose stools will obtain stool studies, systemic inflammatory markers    HPI:    Patient is a 31 year old female with PMH of obesity, vitamin D deficiency , anxiety and depression .  She is new to the practice, referred by PCP for evaluation of elevated liver enzymes and hepatosplenomegaly on [place].   Adelin had labs in May which showed abnormal liver enzymes. The labs were drawn when she presented to PCP with fatigue and a sinus infection. LFTs were repeated in June and still elevated.  Subsequently had US showing enlarged liver and spleen. She has had some bowel changes over the last few months. She has been having unexplained, intermittent loose stools 2-4 times a day   Labs sent by PCP May 2023 Alk phos 127 ( ref 44-121), AST 84 ( ref 0-40), ALT 169 ( ref 0-37) .  Hgb 12.9, platelets 310.  TSH 0.586 Vit D low at 25 Trig 163  07/11/21  alk phos 112, AST 85, ALT 178.   She doesn't think that liver enzymes have been elevated prior to May and has been going to the same PCP most of her life.   She has been on Lexapro for 6 months or so. Prior to that was on Prozac. Other than that she takes Ativan as needed, Ibuprofen on a regular basis. No herbs / supplements. She takes a prenatal gummy. No other medications. She drinks etoh about once a month but none since learning about liver enzyme elevation in  May.   She has gained about 20 pounds over the last few years.   Patient tells me her brother has elevated liver enzymes but never had evaluation. Mother has fatty liver.   Regarding recent bowel changes, prior to a few months ago she had solid BMs once daily. Since then she has had loose stools 2-4 times a day. She has associated abdominal cramps relieved with defecation.. She hasn't seen any blood in her stool. She hasn't changed her diet recently but dines out a lot. She drinks diet sodas. No recent antibiotics.   She has a first cousin with Crohn's disease.   She is interested in conceiving.   Previous Labs / Imaging::    Latest Ref Rng & Units 02/03/2020   10:05 PM 08/25/2013    7:34 PM  CBC  WBC 4.0 - 10.5 K/uL 13.4  15.0   Hemoglobin 12.0 - 15.0 g/dL 13.4  13.3   Hematocrit 36.0 - 46.0 % 41.0  38.9   Platelets 150 - 400 K/uL 312  325     No results found for: "LIPASE"    Latest Ref Rng & Units 02/03/2020   10:05 PM  CMP  Glucose 70 - 99 mg/dL 106   BUN 6 - 20 mg/dL 12   Creatinine 0.44 - 1.00 mg/dL 0.62   Sodium 135 - 145 mmol/L 137   Potassium 3.5 - 5.1 mmol/L  3.2   Chloride 98 - 111 mmol/L 102   CO2 22 - 32 mmol/L 25   Calcium 8.9 - 10.3 mg/dL 9.2     07/20/21 Abdominal US at [facility] Liver measures 16 cm in overall length.  The texture is uniform throughout.  No focal intrahepatic lesions or dilated bile ducts.  Portal vein unremarkable.  Gallbladder normal.  No evidence of gallstones, gallbladder wall thickening or pericholecystic fluid.  Biliary system normal.  CBD 0.33 cm.  Evaluation of the pancreas is limited.  The spleen is enlarged measuring 14 cm in overall length.  The texture is uniform.   Past Medical History:  Diagnosis Date   Anxiety    Depression    GERD (gastroesophageal reflux disease)    Stress headaches    Tonsillar hypertrophy 11/2017   Past Surgical History:  Procedure Laterality Date   NO PAST SURGERIES     TONSILLECTOMY  2019    TONSILLECTOMY AND ADENOIDECTOMY Bilateral 11/15/2017   Procedure: TONSILLECTOMY;  Surgeon: Leta Baptist, MD;  Location: [place];  Service: ENT;  Laterality: Bilateral;   Family History  Problem Relation Age of Onset   Hyperlipidemia Mother    Hyperlipidemia Father    Heart failure Brother    Stroke Brother    Social History   Tobacco Use   Smoking status: Former    Types: Cigarettes    Quit date: 12/12/2016    Years since quitting: 4.7   Smokeless tobacco: Never   Tobacco comments:    social smoker  Vaping Use   Vaping Use: Never used  Substance Use Topics   Alcohol use: Not Currently   Drug use: Not Currently   Current Outpatient Medications  Medication Sig Dispense Refill   escitalopram (LEXAPRO) 20 MG tablet Take by mouth.     ibuprofen (ADVIL) 600 MG tablet Take 1 tablet (600 mg total) by mouth every 6 (six) hours as needed. 30 tablet 0   LORazepam (ATIVAN) 0.5 MG tablet Take 1 tablet by mouth daily as needed.     FLUoxetine (PROZAC) 20 MG tablet Take 20 mg by mouth daily. (Patient not taking: Reported on 08/26/2021)     No current facility-administered medications for this visit.   No Known Allergies   Review of Systems: Positive for anxiety, depression.   All other systems reviewed and negative except where noted in HPI.   Wt Readings from Last 3 Encounters:  02/03/20 195 lb (88.5 kg)  11/15/17 194 lb 0.1 oz (88 kg)  11/24/13 171 lb 11.2 oz (77.9 kg)    Physical Exam   BP (!) 142/92   Pulse 90   Ht 5' 1"  (1.549 m)   Wt 207 lb (93.9 kg)   BMI 39.11 kg/m  Constitutional:  Generally well appearing female in no acute distress. Psychiatric: Pleasant. Normal mood and affect. Behavior is normal. EENT: Pupils normal.  Conjunctivae are normal. No scleral icterus. Neck supple.  Cardiovascular: Normal rate, regular rhythm. No edema Pulmonary/chest: Effort normal and breath sounds normal. No wheezing, rales or rhonchi. Abdominal: Soft, nondistended,  nontender. Bowel sounds active throughout. There are no masses palpable. No hepatomegaly. Neurological: Alert and oriented to person place and time. Skin: Skin is warm and dry. No rashes noted.  Tye Savoy, NP  08/26/2021, 10:55 AM  Cc:  Referring Provider Lanelle Bal, PA-C [place], [place]

## 2021-08-27 ENCOUNTER — Encounter: Payer: Self-pay | Admitting: Nurse Practitioner

## 2021-08-27 LAB — IRON AND TIBC
Iron Saturation: 31 % (ref 15–55)
Iron: 126 ug/dL (ref 27–159)
Total Iron Binding Capacity: 407 ug/dL (ref 250–450)
UIBC: 281 ug/dL (ref 131–425)

## 2021-08-28 NOTE — Progress Notes (Signed)
Agree with assessment and plan as outlined, await serologic work-up for chronic liver disease.  Agree that infectious etiology quite possible.  Would screen for mono nucleus this as well.  If her serologic work-up is negative and her liver enzymes remain elevated, we may need to consider liver biopsy.  We will trend her enzymes and await her serologic work-up for now.

## 2021-08-29 LAB — IGA: Immunoglobulin A: 228 mg/dL (ref 47–310)

## 2021-08-29 LAB — TISSUE TRANSGLUTAMINASE ABS,IGG,IGA: (tTG) Ab, IgG: 1.1 U/mL

## 2021-08-31 ENCOUNTER — Other Ambulatory Visit (INDEPENDENT_AMBULATORY_CARE_PROVIDER_SITE_OTHER): Payer: Self-pay

## 2021-08-31 ENCOUNTER — Other Ambulatory Visit: Payer: Self-pay

## 2021-08-31 DIAGNOSIS — R161 Splenomegaly, not elsewhere classified: Secondary | ICD-10-CM

## 2021-08-31 DIAGNOSIS — R160 Hepatomegaly, not elsewhere classified: Secondary | ICD-10-CM

## 2021-08-31 DIAGNOSIS — R748 Abnormal levels of other serum enzymes: Secondary | ICD-10-CM

## 2021-09-01 ENCOUNTER — Other Ambulatory Visit: Payer: Self-pay

## 2021-09-01 LAB — MONONUCLEOSIS SCREEN: Mono Screen: NEGATIVE

## 2021-09-01 LAB — HEPATITIS A ANTIBODY, IGM: Hep A IgM: NONREACTIVE

## 2021-09-02 ENCOUNTER — Other Ambulatory Visit: Payer: Self-pay

## 2021-09-02 DIAGNOSIS — R748 Abnormal levels of other serum enzymes: Secondary | ICD-10-CM

## 2021-09-02 LAB — ANA: Anti Nuclear Antibody (ANA): NEGATIVE

## 2021-09-02 LAB — ALPHA-1-ANTITRYPSIN: A-1 Antitrypsin, Ser: 150 mg/dL (ref 83–199)

## 2021-09-02 LAB — HEPATITIS B SURFACE ANTIGEN: Hepatitis B Surface Ag: NONREACTIVE

## 2021-09-02 LAB — HEPATITIS C ANTIBODY: Hepatitis C Ab: NONREACTIVE

## 2021-09-02 LAB — HEPATITIS B CORE ANTIBODY, TOTAL: Hep B Core Total Ab: NONREACTIVE

## 2021-09-02 LAB — CERULOPLASMIN: Ceruloplasmin: 37 mg/dL (ref 18–53)

## 2021-09-02 LAB — MITOCHONDRIAL ANTIBODIES: Mitochondrial M2 Ab, IgG: <=20 U (ref <=20.0)

## 2021-09-02 LAB — ANTI-SMOOTH MUSCLE ANTIBODY, IGG: Actin (Smooth Muscle) Antibody (IGG): <20 U (ref <20)

## 2021-09-02 LAB — HEPATITIS A ANTIBODY, TOTAL: Hepatitis A AB,Total: REACTIVE — AB

## 2021-09-02 LAB — TISSUE TRANSGLUTAMINASE ABS,IGG,IGA: (tTG) Ab, IgA: <1 U/mL

## 2021-09-24 ENCOUNTER — Telehealth: Payer: Self-pay

## 2021-09-24 NOTE — Telephone Encounter (Signed)
PT returning call. Please advise. 

## 2021-09-24 NOTE — Telephone Encounter (Signed)
Spoke with pt and let her know about lab that is due. Pt verbalized understanding and had no other concerns at end of call.  

## 2021-09-24 NOTE — Telephone Encounter (Signed)
Left message for pt to call back  °

## 2021-09-24 NOTE — Telephone Encounter (Signed)
-----   Message from Orion Modest, RN sent at 09/02/2021  9:03 AM EDT ----- Regarding: repeat LFTs Pt needs repeat LFTs in 3 weeks which will be 8/23. See 8/1 pt mychart message. Order in epic.

## 2021-09-25 NOTE — Telephone Encounter (Signed)
Spoke with pt. See 8/24 telephone encounter.

## 2021-09-28 ENCOUNTER — Other Ambulatory Visit (INDEPENDENT_AMBULATORY_CARE_PROVIDER_SITE_OTHER): Payer: Self-pay

## 2021-09-28 ENCOUNTER — Other Ambulatory Visit: Payer: Self-pay

## 2021-09-28 DIAGNOSIS — R161 Splenomegaly, not elsewhere classified: Secondary | ICD-10-CM

## 2021-09-28 DIAGNOSIS — R748 Abnormal levels of other serum enzymes: Secondary | ICD-10-CM

## 2021-09-28 DIAGNOSIS — R160 Hepatomegaly, not elsewhere classified: Secondary | ICD-10-CM

## 2021-09-28 LAB — HEPATIC FUNCTION PANEL
ALT: 56 U/L — ABNORMAL HIGH (ref 0–35)
AST: 30 U/L (ref 0–37)
Albumin: 4.2 g/dL (ref 3.5–5.2)
Alkaline Phosphatase: 89 U/L (ref 39–117)
Bilirubin, Direct: 0.1 mg/dL (ref 0.0–0.3)
Total Bilirubin: 0.4 mg/dL (ref 0.2–1.2)
Total Protein: 7.1 g/dL (ref 6.0–8.3)

## 2021-10-29 ENCOUNTER — Telehealth: Payer: Self-pay

## 2021-10-29 NOTE — Telephone Encounter (Signed)
Left message for pt to call back  °

## 2021-10-29 NOTE — Telephone Encounter (Signed)
-----   Message from Marice Potter, RN sent at 09/28/2021 12:12 PM EDT ----- Pt needs repeat LFTs. Order in epic.

## 2021-11-11 NOTE — Telephone Encounter (Signed)
Let pt know about lab work. Pt verbalized understanding.

## 2022-04-20 DIAGNOSIS — R21 Rash and other nonspecific skin eruption: Secondary | ICD-10-CM | POA: Diagnosis not present

## 2022-04-20 DIAGNOSIS — L309 Dermatitis, unspecified: Secondary | ICD-10-CM | POA: Diagnosis not present

## 2022-04-20 DIAGNOSIS — Z6839 Body mass index (BMI) 39.0-39.9, adult: Secondary | ICD-10-CM | POA: Diagnosis not present

## 2022-04-20 DIAGNOSIS — R030 Elevated blood-pressure reading, without diagnosis of hypertension: Secondary | ICD-10-CM | POA: Diagnosis not present

## 2022-09-13 ENCOUNTER — Ambulatory Visit: Payer: Medicaid Other | Admitting: Gastroenterology

## 2023-01-27 ENCOUNTER — Ambulatory Visit: Payer: Medicaid Other | Admitting: Dermatology

## 2023-06-02 IMAGING — DX DG KNEE COMPLETE 4+V*L*
4 series · 4 of 4 positions shown · non-contrast
Comparison: None.

CLINICAL DATA: Left knee pain for the past month.

EXAM:
LEFT KNEE - COMPLETE 4+ VIEW

[knee ap]
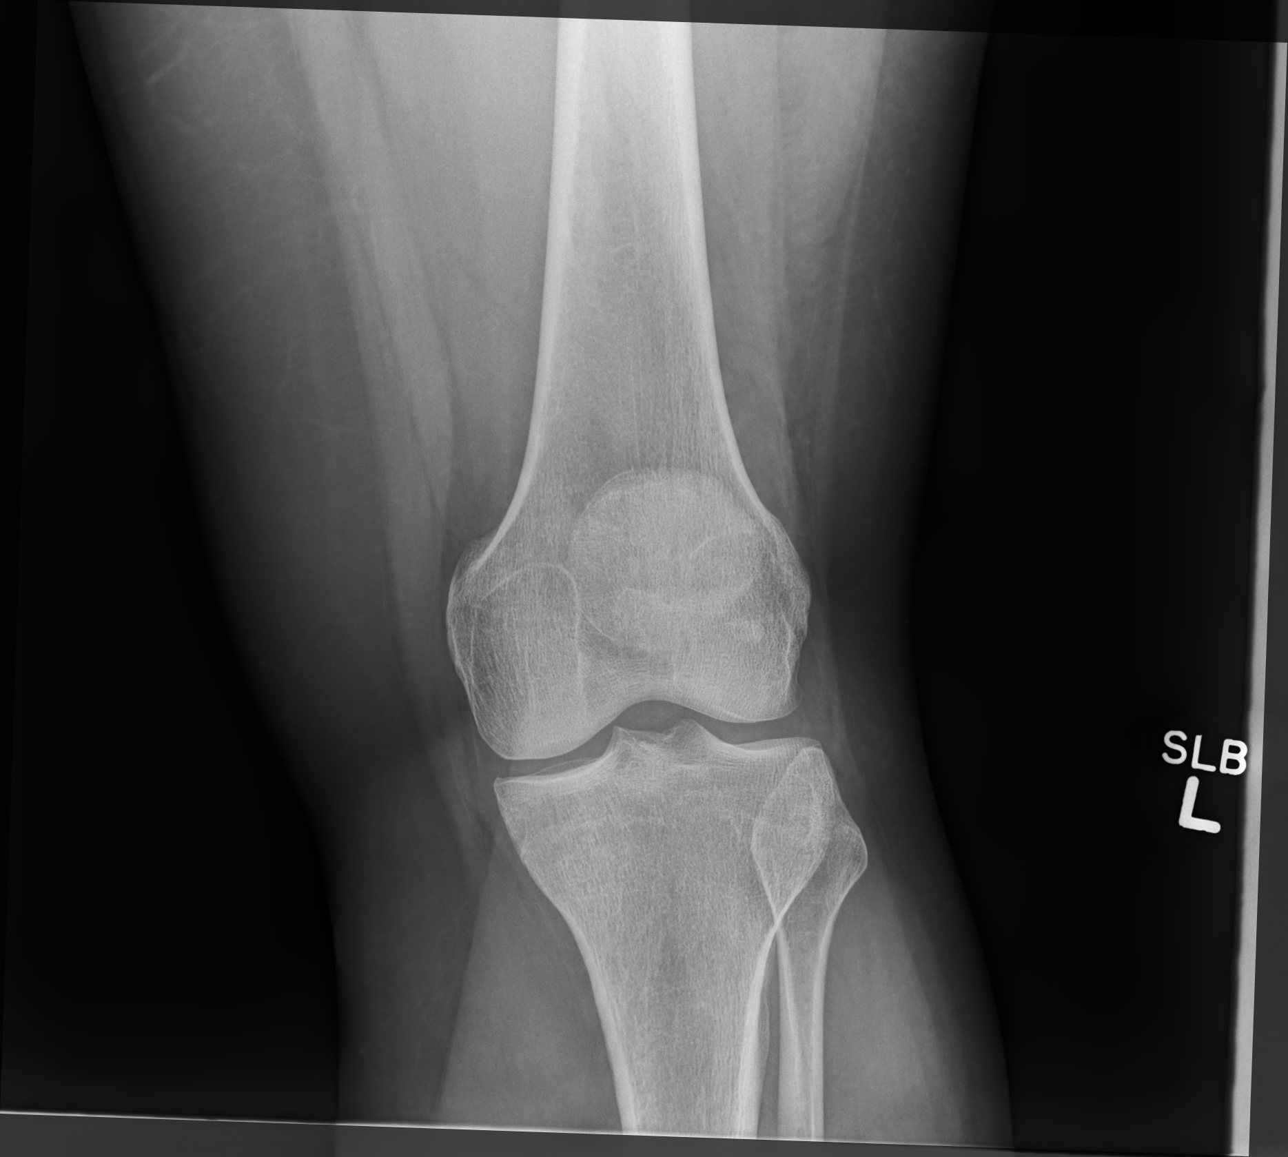

[knee mlo (1 of 2)]
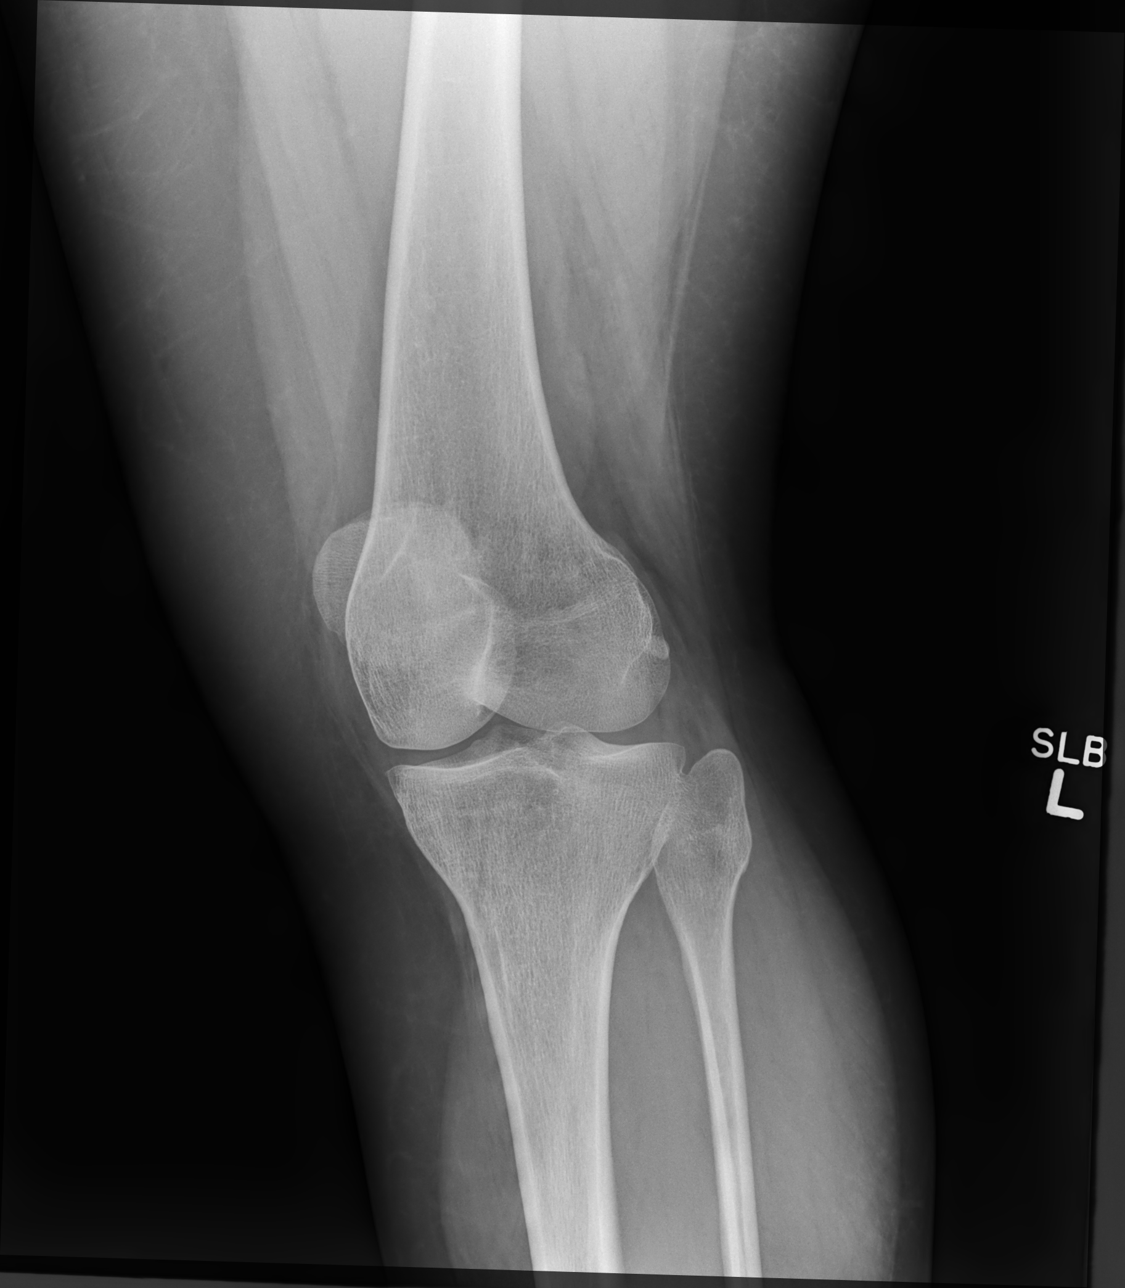

[knee mlo (2 of 2)]
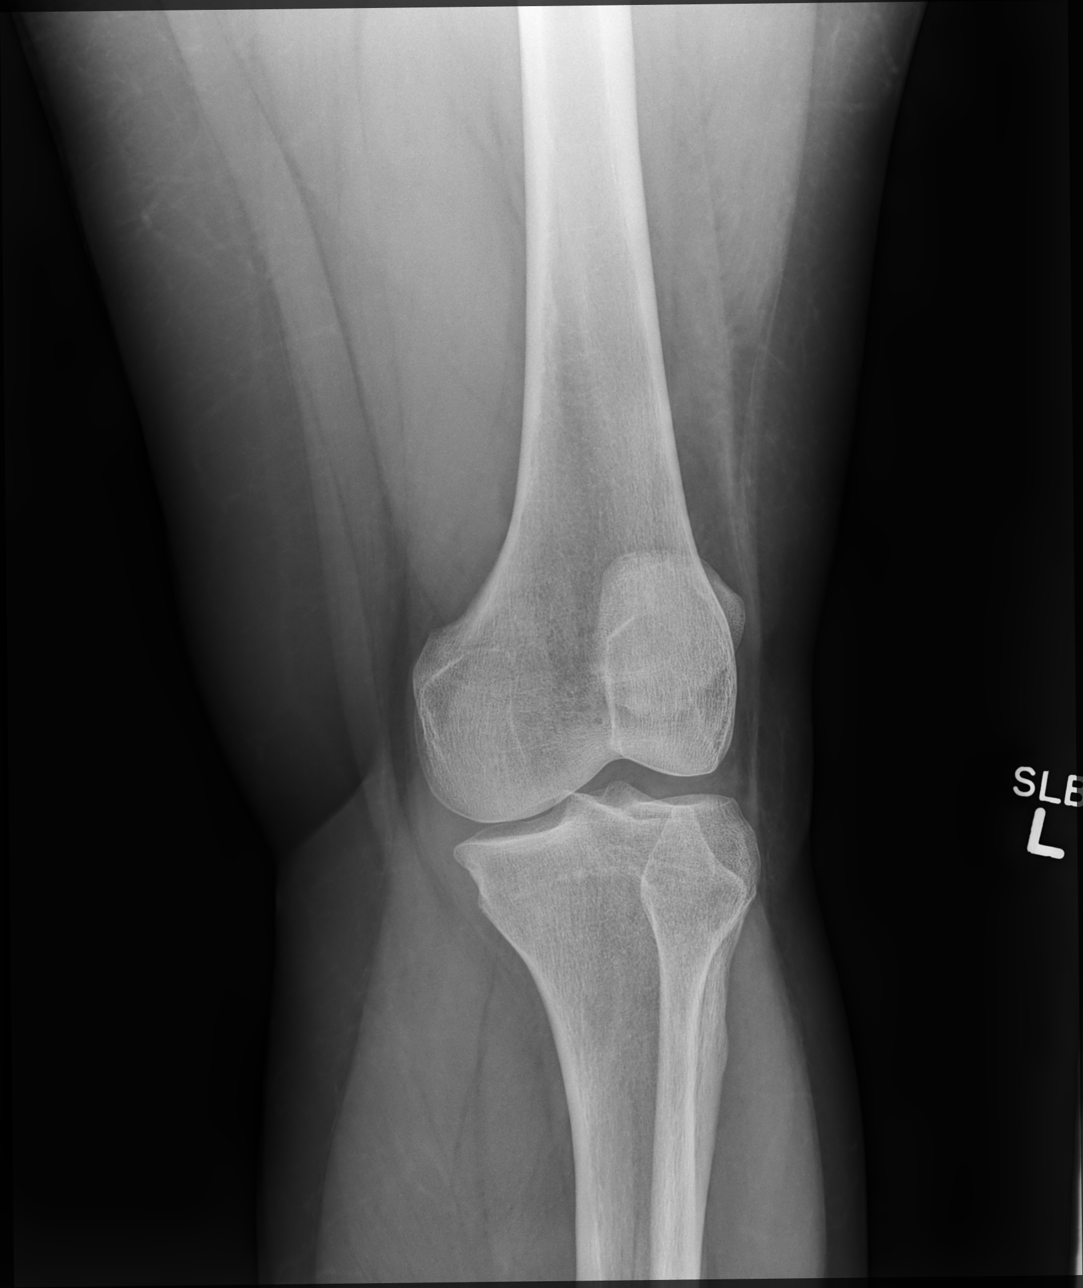

[knee lat]
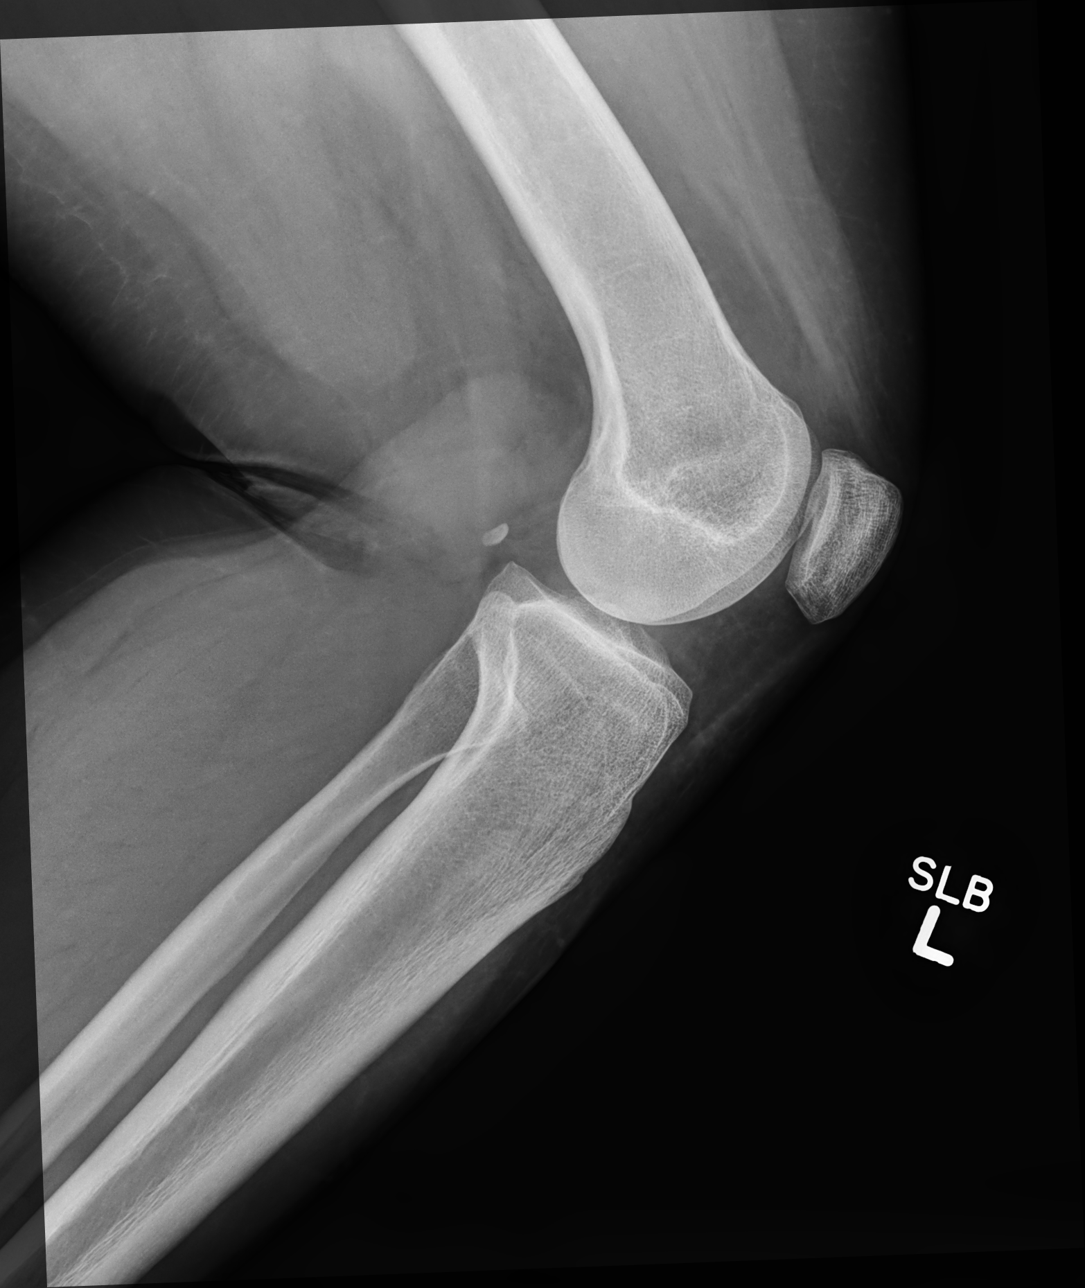

[4 of 4 positions shown; findings below may reference images not displayed]

FINDINGS: No evidence of fracture, dislocation, or joint effusion. No evidence
of arthropathy or other focal bone abnormality. Soft tissues are
unremarkable.
IMPRESSION: Negative.

## 2023-07-27 ENCOUNTER — Ambulatory Visit: Payer: Self-pay | Admitting: General Surgery

## 2023-07-27 NOTE — Pre-Procedure Instructions (Signed)
 Surgical Instructions   Your procedure is scheduled on July 29, 2023. Report to [facility] Main Entrance A at 8:15 A.M., then check in with the Admitting office. Any questions or running late day of surgery: call [phone]  Questions prior to your surgery date: call [phone], Monday-Friday, 8am-4pm. If you experience any cold or flu symptoms such as cough, fever, chills, shortness of breath, etc. between now and your scheduled surgery, please notify us  at the above number.     Remember:  Do not eat after midnight the night before your surgery   You may drink clear liquids until 7:15 AM the morning of your surgery.   Clear liquids allowed are: Water, Non-Citrus Juices (without pulp), Carbonated Beverages, Clear Tea (no milk, honey, etc.), Black Coffee Only (NO MILK, CREAM OR POWDERED CREAMER of any kind), and Gatorade.    Take these medicines the morning of surgery with A SIP OF WATER: buPROPion (WELLBUTRIN XL)  escitalopram (LEXAPRO)    May take these medicines IF NEEDED: acetaminophen (TYLENOL)  LORazepam (ATIVAN)  Polyethyl Glycol-Propyl Glycol (SYSTANE) eye drops   One week prior to surgery, STOP taking any Aspirin (unless otherwise instructed by your surgeon) Aleve, Naproxen, Ibuprofen , Motrin , Advil , Goody's, BC's, all herbal medications, fish oil, and non-prescription vitamins.                     Do NOT Smoke (Tobacco/Vaping) for 24 hours prior to your procedure.  If you use a CPAP at night, you may bring your mask/headgear for your overnight stay.   You will be asked to remove any contacts, glasses, piercing's, hearing aid's, dentures/partials prior to surgery. Please bring cases for these items if needed.    Patients discharged the day of surgery will not be allowed to drive home, and someone needs to stay with them for 24 hours.  SURGICAL WAITING ROOM VISITATION Patients may have no more than 2 support people in the waiting area - these visitors may  rotate.   Pre-op nurse will coordinate an appropriate time for 1 ADULT support person, who may not rotate, to accompany patient in pre-op.  Children under the age of 12 must have an adult with them who is not the patient and must remain in the main waiting area with an adult.  If the patient needs to stay at the hospital during part of their recovery, the visitor guidelines for inpatient rooms apply.  Please refer to the [facility] website for the visitor guidelines for any additional information.   If you received a COVID test during your pre-op visit  it is requested that you wear a mask when out in public, stay away from anyone that may not be feeling well and notify your surgeon if you develop symptoms. If you have been in contact with anyone that has tested positive in the last 10 days please notify you surgeon.      Pre-operative CHG Bathing Instructions   You can play a key role in reducing the risk of infection after surgery. Your skin needs to be as free of germs as possible. You can reduce the number of germs on your skin by washing with CHG (chlorhexidine gluconate) soap before surgery. CHG is an antiseptic soap that kills germs and continues to kill germs even after washing.   DO NOT use if you have an allergy to chlorhexidine/CHG or antibacterial soaps. If your skin becomes reddened or irritated, stop using the CHG and notify one of our RNs at  [phone].              TAKE A SHOWER THE NIGHT BEFORE SURGERY AND THE DAY OF SURGERY    Please keep in mind the following:  DO NOT shave, including legs and underarms, 48 hours prior to surgery.   You may shave your face before/day of surgery.  Place clean sheets on your bed the night before surgery Use a clean washcloth (not used since being washed) for each shower. DO NOT sleep with pet's night before surgery.  CHG Shower Instructions:  Wash your face and private area with normal soap. If you choose to wash your hair, wash  first with your normal shampoo.  After you use shampoo/soap, rinse your hair and body thoroughly to remove shampoo/soap residue.  Turn the water OFF and apply half the bottle of CHG soap to a CLEAN washcloth.  Apply CHG soap ONLY FROM YOUR NECK DOWN TO YOUR TOES (washing for 3-5 minutes)  DO NOT use CHG soap on face, private areas, open wounds, or sores.  Pay special attention to the area where your surgery is being performed.  If you are having back surgery, having someone wash your back for you may be helpful. Wait 2 minutes after CHG soap is applied, then you may rinse off the CHG soap.  Pat dry with a clean towel  Put on clean pajamas    Additional instructions for the day of surgery: DO NOT APPLY any lotions, deodorants, cologne, or perfumes.   Do not wear jewelry or makeup Do not wear nail polish, gel polish, artificial nails, or any other type of covering on natural nails (fingers and toes) Do not bring valuables to the hospital. [facility] is not responsible for valuables/personal belongings. Put on clean/comfortable clothes.  Please brush your teeth.  Ask your nurse before applying any prescription medications to the skin.

## 2023-07-28 ENCOUNTER — Encounter (HOSPITAL_COMMUNITY)
Admission: RE | Admit: 2023-07-28 | Discharge: 2023-07-28 | Disposition: A | Discharge status: Home / Self Care | Source: Ambulatory Visit | Attending: General Surgery | Admitting: General Surgery

## 2023-07-28 ENCOUNTER — Other Ambulatory Visit: Payer: Self-pay

## 2023-07-28 ENCOUNTER — Encounter (HOSPITAL_COMMUNITY): Payer: Self-pay

## 2023-07-28 VITALS — BP 140/77 | HR 94 | Temp 98.1°F | Resp 17 | Ht 60.0 in | Wt 215.6 lb

## 2023-07-28 DIAGNOSIS — Z01818 Encounter for other preprocedural examination: Secondary | ICD-10-CM

## 2023-07-28 DIAGNOSIS — Z01812 Encounter for preprocedural laboratory examination: Secondary | ICD-10-CM | POA: Diagnosis present

## 2023-07-28 HISTORY — DX: Nausea with vomiting, unspecified: R11.2

## 2023-07-28 HISTORY — DX: Other complications of anesthesia, initial encounter: T88.59XA

## 2023-07-28 LAB — CBC
HCT: 36.9 % (ref 36.0–46.0)
Hemoglobin: 11.7 g/dL — ABNORMAL LOW (ref 12.0–15.0)
MCH: 26.4 pg (ref 26.0–34.0)
MCHC: 31.7 g/dL (ref 30.0–36.0)
MCV: 83.1 fL (ref 80.0–100.0)
Platelets: 291 10*3/uL (ref 150–400)
RBC: 4.44 MIL/uL (ref 3.87–5.11)
RDW: 13.9 % (ref 11.5–15.5)
WBC: 9.6 10*3/uL (ref 4.0–10.5)
nRBC: 0 % (ref 0.0–0.2)

## 2023-07-28 NOTE — H&P (Signed)
    HPI  Alicia Goodman is an 33 y.o. female who was seen in clinic on 07/05/23 for symptomatic cholelithiasis.  Patient states she has had at least a few weeks but perhaps a few months of symptoms. She will have RUQ pain and nausea without emesis after PO intake, specifically fatty/greasy foods or when she drinks Sundrop soda. Denies fevers/chills. No changes in color of urine or stools.  She was recently seen at [facility] and was going to be scheduled for surgery there but preferred not to have surgery in [place] and rather have at a hospital in [place].   10 point review of systems is negative except as listed above in HPI.  Objective  Past Medical History: Past Medical History:  Diagnosis Date   Anxiety    Complication of anesthesia    Depression    GERD (gastroesophageal reflux disease)    PONV (postoperative nausea and vomiting)    Stress headaches    Tonsillar hypertrophy 11/2017    Past Surgical History: Past Surgical History:  Procedure Laterality Date   TONSILLECTOMY AND ADENOIDECTOMY Bilateral 11/15/2017   Procedure: TONSILLECTOMY;  Surgeon: Karis Clunes, MD;  Location: [facility];  Service: ENT;  Laterality: Bilateral;    Family History:  Family History  Problem Relation Age of Onset   Hyperlipidemia Mother    Irritable bowel syndrome Mother    Hyperlipidemia Father    Heart failure Brother    Stroke Brother    Breast cancer Maternal Grandmother    Stomach cancer Neg Hx    Esophageal cancer Neg Hx     Social History:  reports that she quit smoking about 6 years ago. Her smoking use included cigarettes. She has never used smokeless tobacco. She reports that she does not currently use alcohol. She reports that she does not currently use drugs.  Allergies: No Known Allergies  Medications: I have reviewed the patient's current medications.  Labs: Pertinent lab work personally reviewed.  Imaging: Pertinent imaging personally  reviewed  Physical Exam Last menstrual period 07/04/2023. General: No acute distress, well appearing HEENT: PERRL, hearing grossly normal, mucous membranes moist CV: Regular rate and rhythm Pulm: Normal work of breathing on room air Abd: Soft, minimal RUQ tenderness, no rebound or guarding. Nondistended Extremities: Warm and well perfused Neuro: A&O x4, no focal neurologic deficits Psych: Appropriate mood and effect     Assessment   Alicia Goodman is an 33 y.o. female with symptomatic cholelithiasis  Plan  - Proceed to OR for laparoscopic cholecystectomy with ICG - We discussed the etiology of patient's pain, we discussed treatment options and recommended surgery. We discussed details of surgery including general anesthesia, laparoscopic approach, identification of cystic duct and common bile duct. Ligation of cystic duct and cystic artery. Possible need for intraoperative cholangiogram, open procedure, and subtotal cholecystectomy. Possible risks of common bile duct injury, injury to surrounding structures, bile leak, bleeding, infection, diarrhea, retained stone and hernia. The patient showed good understanding and all questions were answered.  - Patient voiced preference for no narcotics post-op. We discussed tylenol, ibuprofen  and robaxin prescription which patient is okay with.    Orie Silversmith, MD [facility] Surgery

## 2023-07-28 NOTE — Progress Notes (Signed)
 PCP - Rocky Ivanoff, PA-C with [facility] Cardiologist - Denies  PPM/ICD - Denies Device Orders - n/a Rep Notified - n/a  Chest x-ray - n/a EKG - Denies Stress Test - Denies ECHO - Denies Cardiac Cath - Denies  Sleep Study - Denies CPAP - n/a  No DM  Last dose of GLP1 agonist- n/a GLP1 instructions: n/a  Blood Thinner Instructions: n/a Aspirin Instructions: n/a  ERAS Protcol - Clear liquids until 0715 morning of surgery PRE-SURGERY Ensure or G2- n/a  COVID TEST- n/a   Anesthesia review: No   Patient denies shortness of breath, fever, cough and chest pain at PAT appointment. Pt denies any respiratory illness/infection in the last two months.   All instructions explained to the patient, with a verbal understanding of the material. Patient agrees to go over the instructions while at home for a better understanding. Patient also instructed to self quarantine after being tested for COVID-19. The opportunity to ask questions was provided.

## 2023-07-29 ENCOUNTER — Encounter (HOSPITAL_COMMUNITY): Payer: Self-pay

## 2023-07-29 ENCOUNTER — Ambulatory Visit (HOSPITAL_COMMUNITY): Admission: RE | Admit: 2023-07-29 | Source: Home / Self Care | Admitting: General Surgery

## 2023-07-29 ENCOUNTER — Encounter (HOSPITAL_COMMUNITY): Admission: RE | Payer: Self-pay | Source: Home / Self Care

## 2023-07-29 SURGERY — LAPAROSCOPIC CHOLECYSTECTOMY
Anesthesia: General

## 2023-08-24 ENCOUNTER — Ambulatory Visit: Payer: Self-pay | Admitting: General Surgery

## 2023-09-16 ENCOUNTER — Ambulatory Visit (HOSPITAL_COMMUNITY): Admit: 2023-09-16 | Payer: Self-pay | Admitting: General Surgery

## 2023-09-16 SURGERY — LAPAROSCOPIC CHOLECYSTECTOMY
Anesthesia: General

## 2023-11-25 NOTE — Progress Notes (Signed)
 Surgical Instructions   Your procedure is scheduled on Thursday, November 6th. Report to [facility] Main Entrance A at 7:30 A.M., then check in with the Admitting office. Any questions or running late day of surgery: call [phone]  Questions prior to your surgery date: call [phone], Monday-Friday, 8am-4pm. If you experience any cold or flu symptoms such as cough, fever, chills, shortness of breath, etc. between now and your scheduled surgery, please notify us  at the above number.     Remember:  Do not eat after midnight the night before your surgery   You may drink clear liquids until 6:30 the morning of your surgery.   Clear liquids allowed are: Water, Non-Citrus Juices (without pulp), Carbonated Beverages, Clear Tea (no milk, honey, etc.), Black Coffee Only (NO MILK, CREAM OR POWDERED CREAMER of any kind), and Gatorade.    Take these medicines the morning of surgery with A SIP OF WATER  buPROPion (WELLBUTRIN XL)    May take these medicines IF NEEDED: LORazepam (ATIVAN)  omeprazole (PRILOSEC)  Polyethyl Glycol-Propyl Glycol (SYSTANE) eye drops    One week prior to surgery, STOP taking any Aspirin (unless otherwise instructed by your surgeon) Aleve, Naproxen, Motrin , Goody's, BC's, all herbal medications, fish oil, and non-prescription vitamins.  This includes your ibuprofen  (ADVIL ).                      Do NOT Smoke (Tobacco/Vaping) for 24 hours prior to your procedure.  If you use a CPAP at night, you may bring your mask/headgear for your overnight stay.   You will be asked to remove any contacts, glasses, piercing's, hearing aid's, dentures/partials prior to surgery. Please bring cases for these items if needed.    Patients discharged the day of surgery will not be allowed to drive home, and someone needs to stay with them for 24 hours.  SURGICAL WAITING ROOM VISITATION Patients may have no more than 2 support people in the waiting area - these visitors may  rotate.   Pre-op nurse will coordinate an appropriate time for 1 ADULT support person, who may not rotate, to accompany patient in pre-op.  Children under the age of 50 must have an adult with them who is not the patient and must remain in the main waiting area with an adult.  If the patient needs to stay at the hospital during part of their recovery, the visitor guidelines for inpatient rooms apply.  Please refer to the [facility] website for the visitor guidelines for any additional information.   If you received a COVID test during your pre-op visit  it is requested that you wear a mask when out in public, stay away from anyone that may not be feeling well and notify your surgeon if you develop symptoms. If you have been in contact with anyone that has tested positive in the last 10 days please notify you surgeon.      Pre-operative CHG Bathing Instructions   You can play a key role in reducing the risk of infection after surgery. Your skin needs to be as free of germs as possible. You can reduce the number of germs on your skin by washing with CHG (chlorhexidine  gluconate) soap before surgery. CHG is an antiseptic soap that kills germs and continues to kill germs even after washing.   DO NOT use if you have an allergy to chlorhexidine /CHG or antibacterial soaps. If your skin becomes reddened or irritated, stop using the CHG and notify one of  our RNs at [phone].              TAKE A SHOWER THE NIGHT BEFORE SURGERY   Please keep in mind the following:  DO NOT shave, including legs and underarms, 48 hours prior to surgery.   You may shave your face before/day of surgery.  Place clean sheets on your bed the night before surgery Use a clean washcloth (not used since being washed) for shower. DO NOT sleep with pet's night before surgery.  CHG Shower Instructions:  Wash your face and private area with normal soap. If you choose to wash your hair, wash first with your normal shampoo.   After you use shampoo/soap, rinse your hair and body thoroughly to remove shampoo/soap residue.  Turn the water OFF and apply half the bottle of CHG soap to a CLEAN washcloth.  Apply CHG soap ONLY FROM YOUR NECK DOWN TO YOUR TOES (washing for 3-5 minutes)  DO NOT use CHG soap on face, private areas, open wounds, or sores.  Pay special attention to the area where your surgery is being performed.  If you are having back surgery, having someone wash your back for you may be helpful. Wait 2 minutes after CHG soap is applied, then you may rinse off the CHG soap.  Pat dry with a clean towel  Put on clean pajamas    Additional instructions for the day of surgery: If you choose, you may shower the morning of surgery with an antibacterial soap.  DO NOT APPLY any lotions, deodorants, cologne, or perfumes.   Do not wear jewelry or makeup Do not wear nail polish, gel polish, artificial nails, or any other type of covering on natural nails (fingers and toes) Do not bring valuables to the hospital. [facility] is not responsible for valuables/personal belongings. Put on clean/comfortable clothes.  Please brush your teeth.  Ask your nurse before applying any prescription medications to the skin.

## 2023-11-28 ENCOUNTER — Other Ambulatory Visit: Payer: Self-pay

## 2023-11-28 ENCOUNTER — Encounter (HOSPITAL_COMMUNITY)
Admission: RE | Admit: 2023-11-28 | Discharge: 2023-11-28 | Disposition: A | Discharge status: Home / Self Care | Source: Ambulatory Visit | Attending: General Surgery | Admitting: General Surgery

## 2023-11-28 ENCOUNTER — Encounter (HOSPITAL_COMMUNITY): Payer: Self-pay

## 2023-11-28 VITALS — BP 136/90 | HR 98 | Temp 98.1°F | Resp 16 | Ht 60.0 in | Wt 218.5 lb

## 2023-11-28 DIAGNOSIS — Z01812 Encounter for preprocedural laboratory examination: Secondary | ICD-10-CM | POA: Diagnosis present

## 2023-11-28 DIAGNOSIS — Z01818 Encounter for other preprocedural examination: Secondary | ICD-10-CM

## 2023-11-28 LAB — CBC
HCT: 36.7 % (ref 36.0–46.0)
Hemoglobin: 11.8 g/dL — ABNORMAL LOW (ref 12.0–15.0)
MCH: 26.4 pg (ref 26.0–34.0)
MCHC: 32.2 g/dL (ref 30.0–36.0)
MCV: 82.1 fL (ref 80.0–100.0)
Platelets: 345 K/uL (ref 150–400)
RBC: 4.47 MIL/uL (ref 3.87–5.11)
RDW: 14.2 % (ref 11.5–15.5)
WBC: 10.6 K/uL — ABNORMAL HIGH (ref 4.0–10.5)
nRBC: 0 % (ref 0.0–0.2)

## 2023-11-28 NOTE — Progress Notes (Signed)
 PCP - Dr. Rocky Ivanoff, MD Cardiologist - Denies  PPM/ICD - Denies Device Orders - n/a Rep Notified - n/a  Chest x-ray - n/a EKG - n/a Stress Test - denies ECHO - denies Cardiac Cath - denies  Sleep Study - denies CPAP - n/a  NON-diabetic  Last dose of GLP1 agonist-  denies GLP1 instructions: n/a  Blood Thinner Instructions: denies Aspirin Instructions: denies  ERAS Protcol - Clears until 0630 PRE-SURGERY Ensure or G2- none  COVID TEST- n/a   Anesthesia review: No  Patient denies shortness of breath, fever, cough and chest pain at PAT appointment   All instructions explained to the patient, with a verbal understanding of the material. Patient agrees to go over the instructions while at home for a better understanding. Patient also instructed to self quarantine after being tested for COVID-19. The opportunity to ask questions was provided.

## 2023-11-28 NOTE — Progress Notes (Signed)
 Surgical preop orders have been requested.

## 2023-12-01 ENCOUNTER — Ambulatory Visit: Payer: Self-pay | Admitting: General Surgery

## 2023-12-08 ENCOUNTER — Encounter (HOSPITAL_COMMUNITY): Admission: RE | Payer: Self-pay | Source: Home / Self Care

## 2023-12-08 ENCOUNTER — Ambulatory Visit (HOSPITAL_COMMUNITY): Admission: RE | Admit: 2023-12-08 | Payer: Self-pay | Source: Home / Self Care | Admitting: General Surgery

## 2023-12-08 SURGERY — LAPAROSCOPIC CHOLECYSTECTOMY
Anesthesia: General

## 2023-12-23 ENCOUNTER — Encounter (HOSPITAL_COMMUNITY): Payer: Self-pay | Admitting: Obstetrics & Gynecology

## 2023-12-23 ENCOUNTER — Inpatient Hospital Stay (HOSPITAL_COMMUNITY)

## 2023-12-23 ENCOUNTER — Inpatient Hospital Stay (HOSPITAL_COMMUNITY)
Admission: AD | Admit: 2023-12-23 | Discharge: 2023-12-23 | Disposition: A | Discharge status: Home / Self Care | Attending: Obstetrics & Gynecology | Admitting: Obstetrics & Gynecology

## 2023-12-23 DIAGNOSIS — O208 Other hemorrhage in early pregnancy: Secondary | ICD-10-CM | POA: Insufficient documentation

## 2023-12-23 DIAGNOSIS — O26851 Spotting complicating pregnancy, first trimester: Secondary | ICD-10-CM

## 2023-12-23 DIAGNOSIS — Z3A01 Less than 8 weeks gestation of pregnancy: Secondary | ICD-10-CM | POA: Insufficient documentation

## 2023-12-23 DIAGNOSIS — Z3491 Encounter for supervision of normal pregnancy, unspecified, first trimester: Secondary | ICD-10-CM

## 2023-12-23 LAB — URINALYSIS, ROUTINE W REFLEX MICROSCOPIC
Bilirubin Urine: NEGATIVE
Glucose, UA: NEGATIVE mg/dL
Hgb urine dipstick: NEGATIVE
Ketones, ur: NEGATIVE mg/dL
Leukocytes,Ua: NEGATIVE
Nitrite: NEGATIVE
Protein, ur: NEGATIVE mg/dL
Specific Gravity, Urine: 1.014 (ref 1.005–1.030)
pH: 6 (ref 5.0–8.0)

## 2023-12-23 LAB — CBC
HCT: 35.8 % — ABNORMAL LOW (ref 36.0–46.0)
Hemoglobin: 12 g/dL (ref 12.0–15.0)
MCH: 27.1 pg (ref 26.0–34.0)
MCHC: 33.5 g/dL (ref 30.0–36.0)
MCV: 80.8 fL (ref 80.0–100.0)
Platelets: 382 K/uL (ref 150–400)
RBC: 4.43 MIL/uL (ref 3.87–5.11)
RDW: 14.6 % (ref 11.5–15.5)
WBC: 15.5 K/uL — ABNORMAL HIGH (ref 4.0–10.5)
nRBC: 0 % (ref 0.0–0.2)

## 2023-12-23 LAB — WET PREP, GENITAL
Clue Cells Wet Prep HPF POC: NONE SEEN
Sperm: NONE SEEN
Trich, Wet Prep: NONE SEEN
WBC, Wet Prep HPF POC: <10 (ref <10)
Yeast Wet Prep HPF POC: NONE SEEN

## 2023-12-23 LAB — ABO/RH: ABO/RH(D): O POS

## 2023-12-23 LAB — HCG, QUANTITATIVE, PREGNANCY: hCG, Beta Chain, Quant, S: 42068 m[IU]/mL — ABNORMAL HIGH (ref <5)

## 2023-12-23 MED ORDER — PRENATAL 28-0.8 MG PO TABS: 1.0000 | ORAL_TABLET | Freq: Every day | ORAL | 12 refills | Status: AC

## 2023-12-23 MED ORDER — DOXYLAMINE-PYRIDOXINE 10-10 MG PO TBEC
2.0000 | DELAYED_RELEASE_TABLET | Freq: Every day | ORAL | 5 refills | Status: AC
Start: 2023-12-23 — End: ?

## 2023-12-23 NOTE — MAU Note (Addendum)
 Pt says has vag spotting - started Wed- comes / goes - when she wipes- pink. Still same. Cramps- started weeks ago- comes/ goes.  No meds for cramping  Took XS Tyl yesterday  for H/A.  Has been going to [facility] in [place] - but plans to transfer to [facility]- GYN Has had U/S - IUP

## 2023-12-23 NOTE — MAU Provider Note (Signed)
 Chief Complaint:  Vaginal Bleeding and Abdominal Pain   HPI   Alicia Goodman is a 33 y.o. G2P1000 at [redacted]w[redacted]d who presents to maternity admissions reporting lower abdominal cramping for 6 weeks. Describes pain as intermittent and radiating into her legs. Also having 1 week of intermittent spotting when wiping, happening more when she has a bowel movement, denies blood dripping into the toilet or in her underwear, not filling a pad. Having a white, creamy discharge as well, denies odor. Denies frank bleeding or filling up a menstrual pad. Had an ultrasound done at [facility] 11/14 which showed an IUP but noted that the fetal heart rate was low at that time.  Pregnancy Course: Receives prenatal care at [facility] in [employer].  Past Medical History:  Diagnosis Date   Anxiety    Complication of anesthesia    Depression    GERD (gastroesophageal reflux disease)    PONV (postoperative nausea and vomiting)    Stress headaches    Tonsillar hypertrophy 11/2017   OB History  Gravida Para Term Preterm AB Living  2 1 1  0 0   SAB IAB Ectopic Multiple Live Births  0 0 0      # Outcome Date GA Lbr Len/2nd Weight Sex Type Anes PTL Lv  2 Current           1 Term            Past Surgical History:  Procedure Laterality Date   TONSILLECTOMY AND ADENOIDECTOMY Bilateral 11/15/2017   Procedure: TONSILLECTOMY;  Surgeon: Karis Clunes, MD;  Location: [facility];  Service: ENT;  Laterality: Bilateral;   Family History  Problem Relation Age of Onset   Hyperlipidemia Mother    Irritable bowel syndrome Mother    Hyperlipidemia Father    Heart failure Brother    Stroke Brother    Breast cancer Maternal Grandmother    Stomach cancer Neg Hx    Esophageal cancer Neg Hx    Social History   Tobacco Use   Smoking status: Former    Current packs/day: 0.00    Types: Cigarettes    Quit date: 12/12/2016    Years since quitting: 7.0   Smokeless tobacco: Never   Tobacco comments:    social smoker  Vaping  Use   Vaping status: Never Used  Substance Use Topics   Alcohol use: Not Currently   Drug use: Not Currently   Allergies  Allergen Reactions   Propranolol Nausea Only    lightheaded   No medications prior to admission.    I have reviewed patient's Past Medical Hx, Surgical Hx, Family Hx, Social Hx, medications and allergies.   ROS  Pertinent items noted in HPI and remainder of comprehensive ROS otherwise negative.   PHYSICAL EXAM  Patient Vitals for the past 24 hrs:  BP Temp Temp src Pulse Resp Height Weight  12/23/23 2249 -- -- -- -- 12 -- --  12/23/23 1945 139/84 98.5 F (36.9 C) Oral 90 12 5' (1.524 m) 99.2 kg    Constitutional: Well-developed, well-nourished female in no acute distress.  Cardiovascular: Warm and well-perfused Respiratory: normal effort, no problems with respiration noted GI: Abd soft, non-tender, non-distended MS: Extremities nontender, no edema, normal ROM Neurologic: Alert and oriented x 4.  Pelvic: deferred    Labs: Results for orders placed or performed during the hospital encounter of 12/23/23 (from the past 24 hours)  Urinalysis, Routine w reflex microscopic -Urine, Clean Catch     Status: None  Collection Time: 12/23/23  7:56 PM  Result Value Ref Range   Color, Urine YELLOW YELLOW   APPearance CLEAR CLEAR   Specific Gravity, Urine 1.014 1.005 - 1.030   pH 6.0 5.0 - 8.0   Glucose, UA NEGATIVE NEGATIVE mg/dL   Hgb urine dipstick NEGATIVE NEGATIVE   Bilirubin Urine NEGATIVE NEGATIVE   Ketones, ur NEGATIVE NEGATIVE mg/dL   Protein, ur NEGATIVE NEGATIVE mg/dL   Nitrite NEGATIVE NEGATIVE   Leukocytes,Ua NEGATIVE NEGATIVE  Wet prep, genital     Status: None   Collection Time: 12/23/23  7:58 PM  Result Value Ref Range   Yeast Wet Prep HPF POC NONE SEEN NONE SEEN   Trich, Wet Prep NONE SEEN NONE SEEN   Clue Cells Wet Prep HPF POC NONE SEEN NONE SEEN   WBC, Wet Prep HPF POC <10 <10   Sperm NONE SEEN   ABO/Rh     Status: None   Collection  Time: 12/23/23  8:13 PM  Result Value Ref Range   ABO/RH(D) O POS    No rh immune globuloin      NOT A RH IMMUNE GLOBULIN CANDIDATE, PT RH POSITIVE Performed at [facility], [address] [place], [place] [address]   CBC     Status: Abnormal   Collection Time: 12/23/23  8:16 PM  Result Value Ref Range   WBC 15.5 (H) 4.0 - 10.5 K/uL   RBC 4.43 3.87 - 5.11 MIL/uL   Hemoglobin 12.0 12.0 - 15.0 g/dL   HCT 64.1 (L) 63.9 - 53.9 %   MCV 80.8 80.0 - 100.0 fL   MCH 27.1 26.0 - 34.0 pg   MCHC 33.5 30.0 - 36.0 g/dL   RDW 85.3 88.4 - 84.4 %   Platelets 382 150 - 400 K/uL   nRBC 0.0 0.0 - 0.2 %  hCG, quantitative, pregnancy     Status: Abnormal   Collection Time: 12/23/23  8:16 PM  Result Value Ref Range   hCG, Beta Chain, Quant, S 42,068 (H) <5 mIU/mL    Imaging:  US  OB Transvaginal Result Date: 12/23/2023 CLINICAL DATA:  Spotting EXAM: TRANSVAGINAL OB ULTRASOUND TECHNIQUE: Transvaginal ultrasound was performed for complete evaluation of the gestation as well as the maternal uterus, adnexal regions, and pelvic cul-de-sac. COMPARISON:  None Available. FINDINGS: Intrauterine gestational sac: Single Yolk sac:  Visualized. Embryo:  Visualized. Cardiac Activity: Visualized. Heart Rate: 126 bpm CRL:   8.5 mm   6 w 5 d                  US  EDC: 08/12/2024 Subchorionic hemorrhage: Small subchorionic hemorrhage is visualized. Maternal uterus/adnexae: The ovaries are not visualized. No pelvic free fluid. IMPRESSION: 1. Single live intrauterine gestation measuring 6 weeks 5 days by crown-rump length. 2.  Small subchorionic hemorrhage. Electronically Signed   By: Greig Pique M.D.   On: 12/23/2023 22:13    MDM & MAU COURSE  MDM: Moderate  MAU Course: Orders Placed This Encounter  Procedures   Wet prep, genital   US  OB Transvaginal   Urinalysis, Routine w reflex microscopic -Urine, Clean Catch   CBC   hCG, quantitative, pregnancy   ABO/Rh   Discharge patient   Meds ordered this  encounter  Medications   Prenatal 28-0.8 MG TABS    Sig: Take 1 tablet by mouth daily.    Dispense:  30 tablet    Refill:  12   Doxylamine -Pyridoxine  (DICLEGIS ) 10-10 MG TBEC    Sig:  Take 2 tablets by mouth at bedtime. If symptoms persist, add one tablet in the morning and one in the afternoon    Dispense:  100 tablet    Refill:  5   VSS. Exam unremarkable. hCG showing appropriate rise. US  showing IUP with fetal heart rate 126bpm, higher than previous ultrasound. Discussed results with patient and provided picture of ultrasound. Requesting prenatal vitamin and medication for nausea, medications sent to preferred pharmacy. Desires transfer of care, given list of prenatal providers in the area. All questions answered prior to discharge. ASSESSMENT   1. [redacted] weeks gestation of pregnancy   2. Normal IUP (intrauterine pregnancy) on prenatal ultrasound, first trimester   3. Spotting affecting pregnancy in first trimester   4. Subchorionic hemorrhage of placenta in first trimester     PLAN  Discharge home in stable condition with return precautions.  Follow up with OB as scheduled.    Allergies as of 12/23/2023       Reactions   Propranolol Nausea Only   lightheaded        Medication List     STOP taking these medications    ALIGN DUALBIOTIC PO   buPROPion 150 MG 24 hr tablet Commonly known as: WELLBUTRIN XL   ferrous sulfate 325 (65 FE) MG EC tablet   ibuprofen  800 MG tablet Commonly known as: ADVIL    LORazepam 0.5 MG tablet Commonly known as: ATIVAN   nitrofurantoin (macrocrystal-monohydrate) 100 MG capsule Commonly known as: MACROBID   prenatal multivitamin Tabs tablet       TAKE these medications    Doxylamine -Pyridoxine  10-10 MG Tbec Commonly known as: Diclegis  Take 2 tablets by mouth at bedtime. If symptoms persist, add one tablet in the morning and one in the afternoon   escitalopram 20 MG tablet Commonly known as: LEXAPRO Take 20 mg by mouth at  bedtime.   omeprazole 40 MG capsule Commonly known as: PRILOSEC Take 40 mg by mouth daily as needed.   Prenatal 28-0.8 MG Tabs Take 1 tablet by mouth daily.   Systane 0.4-0.3 % Soln Generic drug: Polyethyl Glycol-Propyl Glycol Place 1-2 drops into both eyes 3 (three) times daily as needed (dry/irritated eyes.).   triamcinolone cream 0.1 % Commonly known as: KENALOG Apply 1 Application topically daily as needed (skin irritation.).        Charlie Courts, MD  Family Medicine - Obstetrics Fellow

## 2023-12-23 NOTE — Discharge Instructions (Signed)

## 2023-12-26 LAB — GC/CHLAMYDIA PROBE AMP (~~LOC~~) NOT AT ARMC
Chlamydia: NEGATIVE
Comment: NEGATIVE
Comment: NORMAL
Neisseria Gonorrhea: NEGATIVE

## 2023-12-26 NOTE — Progress Notes (Signed)
 CC: amenorrhea  HPI: Alicia Goodman [id number] Reports that she had a missed menstrual cycle at home, she proceeded with a pregnancy test at home which was positive.   OB History     Gravida  2   Para  1   Term  1   Preterm      AB      Living  1      SAB      IAB      Ectopic      Molar      Multiple      Live Births  1           Previous pregnancy history includes: - 2012 SVD without complications  Tobacco Use History[1]  Patient is taking a PNV.  Denies family history of genetic or chromosomal abnormalities. Patient denies family history of Spinal Muscular Atrophy, Cystic Fibrosis, Fragile X Syndrome, or Tay-Sachs Disease (denies Ashkenazi Jewish, French-Canadian, or Cajun descent).  Pregnancy complicated by  - Bleeding in early pregnancy, patient has not had any further episodes, was also seen at [facility] unit on December 23, 2023, small subchorionic hemorrhage identified.  No longer visualized on ultrasound today. - Obesity in pregnancy, pregravid 42.  Referral previously entered for [facility] virtual nutrition program. - Depression, continues on Lexapro, weaned off of Ativan.  Doing well at this time with current medications, referral entered to [facility] women's health mood disorder clinic.  Current concerns:  - Patient was prescribed Diclegis  by Jolynn Pack, she has yet to start medication for her nausea. - She request a prescription for prenatal vitamin.  Past Medical History[2]  Current Medications[3]  Allergies[4]  Past Surgical History[5]   Family History[6]    ROS: Balance of 10 systems negative.   PE:  BP 132/84 (BP Position: Sitting)   Ht 154.9 cm (5' 1)   Wt 98.7 kg (217 lb 9.6 oz)   LMP 05/04/2023   BMI 41.12 kg/m    Constitutional: Well-developed, well-nourished female in no acute distress Respiratory: Clear to auscultation bilaterally. Good air movement with normal work of  breathing. Cardiovascular: Regular rate  and rhythm. Extremities grossly normal, nontender with no edema; pulses regular Gastrointestinal: Soft, nontender, nondistended. No masses or hernias appreciated. No hepatosplenomegaly. No fluid wave. No rebound or guarding. Back: No CVAT Bilaterally Thyroid: Normal, no thyromegaly, or nodules Peripheral: Radial pulses 2+ bilaterally, DP pulses bilaterally 2+ Skin: Warm dry, no rashes or skin changes.      Conclusion  1. Bleeding in early pregnancy (HHS-HCC) (Primary) Resolved.  Please continue to monitor for any bleeding, if this were to occur please present to the birthing center immediately for evaluation.  2. Depression affecting pregnancy in first trimester, antepartum (HHS-HCC) Pending referral to [facility] women's health mood disorder clinic.  If any suicidal thoughts or ideations, please call 911 or present to the emergency room immediately for evaluation.  3. Obesity in pregnancy (HHS-HCC) Plan for antenatal testing starting at 34 weeks.  4. Fetal bradycardia, antepartum condition or complication (HHS-HCC) Resolved.  5. Encounter for other specified antenatal screening (HHS-HCC)    -Ultrasound: Positive pregnancy test in office, ultrasound today with single viable IUP present, 136 bpm. Dates were confirmed. Estimated Date of Delivery: 08/12/24  -OB panel blood work to be drawn today. Rx for PNV.   -Reviewed new pregnancy welcome packet including guidelines on approved OTC medications, common complaints of pregnancy. Reviewed how to contact triage for any concerns including serious symptoms such as vaginal  bleeding, rupture of membranes or water breaking, severe abdominal pain, severe or persistent headaches, fever greater than 101, and persistent vomiting. Patient verbalizes understanding.   -Reviewed options for carrier screening for genetic mutations as recommended by ACOG. Discussed routine screening which include screening for spinal muscular atrophy (SMA), cystic fibrosis  (CF),  and screening for hemoglobinopathies depending on ethnicity. She does not have any family history of Fragile X Syndrome or Tay-Sachs Disease (or qualifying ethnicity). Discussed options of proceed with routine screening versus more detailed screening options by [employer] genetic carrier screening panel. Discussed risks, benefits, limitations of screening. Patient has decided to proceed with testing at next appointment..  -Reviewed options for screening for chromosomal abnormalities as recommended by ACOG. These options include proceeding without screening, first trimester screening including a nuchal translucency measurement, or  non invasive prenatal testing (NIPT). We have dicussed the risks, benefits, limitations associated with testing. Patient has decided to proceed with testing at next appointment..   - BMI  30.0 kg/m2 (obese): weight gain 11 to 20 lbs (5 to 9.0 kg)    - Follow-up in 4 weeks for new OB visit.  Burnard Lennie Amas, FNP               [1] Social History Tobacco Use  Smoking Status Never  . Passive exposure: Never  Smokeless Tobacco Never  [2] Past Medical History: Diagnosis Date  . Abnormal Pap smear of cervix   . Depression   . Hypertension   [3]  Current Outpatient Medications:  .  doxylamine -pyridoxine , vit B6, (DICLEGIS ) 10-10 mg tablet, Take 2 tablets by mouth., Disp: , Rfl:  .  escitalopram oxalate (LEXAPRO) 20 MG tablet, Take 1 tablet (20 mg total) by mouth daily., Disp: , Rfl:  .  ferrous sulfate 325 (65 FE) MG EC tablet, Take 1 tablet (325 mg total) by mouth Three (3) times a day with a meal., Disp: , Rfl:  .  omeprazole (PRILOSEC) 40 MG capsule, Take 1 capsule (40 mg total) by mouth daily., Disp: , Rfl:  .  PNV,calcium 72-iron,carb-folic (PRENATAL PLUS) 29 mg iron- 1 mg Tab, Take 1 tablet by mouth daily., Disp: 84 tablet, Rfl: 3 [4] Allergies Allergen Reactions  . Propranolol Nausea Only    lightheaded  [5] Past Surgical  History: Procedure Laterality Date  . COLPOSCOPY    . TONSILLECTOMY AND ADENOIDECTOMY    [6] Family History Problem Relation Age of Onset  . Hypertension Mother   . Cancer Maternal Grandmother   . Breast cancer Maternal Grandmother

## 2023-12-26 NOTE — Progress Notes (Signed)
 Patient presents after ultrasound for her confirmation. She is 7.3 weeks, EDD 08/10/24. G2P1. SVD x1 Patient is requesting a prescription for a PNV. Reports that vaginal spotting has decreased since her last visit.

## 2023-12-27 ENCOUNTER — Ambulatory Visit: Payer: Self-pay | Admitting: Obstetrics & Gynecology

## 2023-12-27 NOTE — Telephone Encounter (Signed)
 Agree with plan. No need for serial HCG. Ultrasound yesterday with viable IUP. Track bleeding-if it were to worsen/have pelvic pain present to ER for evaluation.

## 2024-01-23 ENCOUNTER — Encounter: Admitting: Obstetrics and Gynecology

## 2024-01-30 LAB — PANORAMA PRENATAL TEST FULL PANEL:PANORAMA TEST PLUS 5 ADDITIONAL MICRODELETIONS: FETAL FRACTION: 2.5
